# Patient Record
Sex: Male | Born: 1965 | Hispanic: No | Marital: Single | Smoking: Never smoker
Health system: Southern US, Community
[De-identification: ages and names within clinical notes are randomized; demographics above are authoritative.]

## PROBLEM LIST (undated history)

## (undated) DIAGNOSIS — Q8711 Prader-Willi syndrome: Secondary | ICD-10-CM

## (undated) DIAGNOSIS — E669 Obesity, unspecified: Secondary | ICD-10-CM

## (undated) DIAGNOSIS — F79 Unspecified intellectual disabilities: Secondary | ICD-10-CM

## (undated) DIAGNOSIS — I890 Lymphedema, not elsewhere classified: Secondary | ICD-10-CM

## (undated) HISTORY — DX: Unspecified intellectual disabilities: F79

## (undated) HISTORY — DX: Obesity, unspecified: E66.9

## (undated) HISTORY — PX: STRABISMUS SURGERY: SHX218

## (undated) HISTORY — DX: Prader-Willi syndrome: Q87.11

## (undated) HISTORY — DX: Lymphedema, not elsewhere classified: I89.0

---

## 2005-09-03 ENCOUNTER — Ambulatory Visit: Payer: Self-pay | Admitting: Internal Medicine

## 2006-02-08 ENCOUNTER — Emergency Department: Payer: Self-pay | Admitting: Emergency Medicine

## 2007-10-16 ENCOUNTER — Encounter: Payer: Self-pay | Admitting: Internal Medicine

## 2007-11-05 ENCOUNTER — Encounter: Payer: Self-pay | Admitting: Internal Medicine

## 2007-12-03 ENCOUNTER — Encounter: Payer: Self-pay | Admitting: Internal Medicine

## 2008-01-03 ENCOUNTER — Encounter: Payer: Self-pay | Admitting: Internal Medicine

## 2010-01-09 ENCOUNTER — Telehealth: Payer: Self-pay | Admitting: Internal Medicine

## 2010-01-27 ENCOUNTER — Ambulatory Visit: Payer: Self-pay | Admitting: Internal Medicine

## 2010-01-27 ENCOUNTER — Encounter: Payer: Self-pay | Admitting: Internal Medicine

## 2010-01-27 DIAGNOSIS — I890 Lymphedema, not elsewhere classified: Secondary | ICD-10-CM

## 2010-01-27 DIAGNOSIS — F70 Mild intellectual disabilities: Secondary | ICD-10-CM | POA: Insufficient documentation

## 2010-01-27 DIAGNOSIS — K644 Residual hemorrhoidal skin tags: Secondary | ICD-10-CM | POA: Insufficient documentation

## 2010-01-27 DIAGNOSIS — L02219 Cutaneous abscess of trunk, unspecified: Secondary | ICD-10-CM

## 2010-01-27 DIAGNOSIS — L89899 Pressure ulcer of other site, unspecified stage: Secondary | ICD-10-CM

## 2010-01-27 DIAGNOSIS — Q8711 Prader-Willi syndrome: Secondary | ICD-10-CM | POA: Insufficient documentation

## 2010-01-27 DIAGNOSIS — L03319 Cellulitis of trunk, unspecified: Secondary | ICD-10-CM

## 2010-01-28 ENCOUNTER — Encounter: Payer: Self-pay | Admitting: Internal Medicine

## 2010-02-03 ENCOUNTER — Telehealth: Payer: Self-pay | Admitting: Internal Medicine

## 2010-02-03 ENCOUNTER — Encounter: Payer: Self-pay | Admitting: Internal Medicine

## 2010-02-04 ENCOUNTER — Encounter: Payer: Self-pay | Admitting: Internal Medicine

## 2010-02-10 ENCOUNTER — Encounter: Payer: Self-pay | Admitting: Internal Medicine

## 2010-02-17 ENCOUNTER — Ambulatory Visit: Payer: Self-pay | Admitting: Internal Medicine

## 2010-02-19 ENCOUNTER — Encounter: Payer: Self-pay | Admitting: Internal Medicine

## 2010-02-24 ENCOUNTER — Telehealth: Payer: Self-pay | Admitting: Internal Medicine

## 2010-03-10 ENCOUNTER — Telehealth: Payer: Self-pay | Admitting: Internal Medicine

## 2010-03-10 ENCOUNTER — Encounter: Payer: Self-pay | Admitting: Internal Medicine

## 2010-03-17 ENCOUNTER — Encounter: Payer: Self-pay | Admitting: Internal Medicine

## 2010-03-18 ENCOUNTER — Encounter: Payer: Self-pay | Admitting: Internal Medicine

## 2010-03-19 ENCOUNTER — Encounter: Payer: Self-pay | Admitting: Internal Medicine

## 2010-03-31 ENCOUNTER — Encounter: Payer: Self-pay | Admitting: Internal Medicine

## 2010-03-31 ENCOUNTER — Ambulatory Visit: Payer: Self-pay | Admitting: Internal Medicine

## 2010-03-31 DIAGNOSIS — F340 Cyclothymic disorder: Secondary | ICD-10-CM | POA: Insufficient documentation

## 2010-03-31 DIAGNOSIS — F39 Unspecified mood [affective] disorder: Secondary | ICD-10-CM

## 2010-07-01 ENCOUNTER — Ambulatory Visit: Payer: Self-pay | Admitting: Internal Medicine

## 2010-07-01 ENCOUNTER — Encounter: Payer: Self-pay | Admitting: Internal Medicine

## 2010-07-01 DIAGNOSIS — L280 Lichen simplex chronicus: Secondary | ICD-10-CM

## 2010-09-30 ENCOUNTER — Encounter: Payer: Self-pay | Admitting: Internal Medicine

## 2010-10-12 ENCOUNTER — Telehealth: Payer: Self-pay | Admitting: Internal Medicine

## 2010-10-26 ENCOUNTER — Telehealth: Payer: Self-pay | Admitting: Internal Medicine

## 2010-10-28 ENCOUNTER — Encounter: Payer: Self-pay | Admitting: Internal Medicine

## 2010-10-28 ENCOUNTER — Other Ambulatory Visit: Payer: Self-pay | Admitting: Internal Medicine

## 2010-10-29 ENCOUNTER — Telehealth: Payer: Self-pay | Admitting: Internal Medicine

## 2010-11-03 NOTE — Progress Notes (Signed)
Summary: Nurse visits  Phone Note Call from Patient Call back at Home Phone [phone]   Caller: Mom Call For: Cindee Salt MD Summary of Call: pt's mother is wondering what the nurses schedule is for coming out to see the patient? nurse had to re-schedule and mom was wondering how many times nurse should be coming out? Please advise Initial call taken by: DeShannon Katrinka Blazing CMA Duncan Dull),  Feb 24, 2010 3:53 PM  Follow-up for Phone Call        I really don't know  she has to check with [facility] Follow-up by: Cindee Salt MD,  Feb 24, 2010 8:02 PM  Additional Follow-up for Phone Call Additional follow up Details #1::        pt's mother wanted to know if it's necessary for the nurse to continue, mother thinks it's depression. DeShannon Smith CMA Duncan Dull)  Feb 25, 2010 4:35 PM   I think it would help to have someone visit and I cannot get out there that often Cindee Salt MD  Feb 25, 2010 7:31 PM   mom states she will ask the nurse to call us Additional Follow-up by: Mervin Hack CMA [facility] Dull),  Feb 26, 2010 2:32 PM

## 2010-11-03 NOTE — Letter (Signed)
Summary: Order for [facility]  Order for [facility]   Imported By: Lanelle Bal 03/17/2010 09:52:50  _____________________________________________________________________  External Attachment:    Type:   Image     Comment:   External Document

## 2010-11-03 NOTE — Assessment & Plan Note (Signed)
Summary: Home visit   Vital Signs:  Patient profile:   45 year old male Pulse rate:   84 / minute Resp:     20 per minute BP sitting:   110 / 60  (right arm) Cuff size:   large  Serial Vital Signs/Assessments:  Comments: done at right wrist By: Cindee Salt MD   CC: follow up home visit   History of Present Illness:  Mom here as usual  Onlhy took 1 fluoxetine then flushed the rest down the toilet Has actually improved a little Mom wonders if he is getting over the grieving process finally Still hasn't been out of the house--now up to 1 year being inside  Has been refusing lymphedema treatments lately  Don't seem any worse though Has a few open areas moslty where he scratches  Gets up and down the stairs--but doesn't go up that often Mom got him a new cable box so he has more TV choices Likes Transformers and other toys still  Rash on abdomen and arms Itch and scratch  Allergies: No Known Drug Allergies  Past History:  Past medical, surgical, family and social histories (including risk factors) reviewed for relevance to current acute and chronic problems.  Past Medical History: Reviewed history from 01/27/2010 and no changes required. Prader-Wiil syndrome Lymphedema Hemorrhoids Obestiy Mental retardation  Past Surgical History: Reviewed history from 01/27/2010 and no changes required. Strabismus surgery x 2--unsuccessful (as child)  Family History: Reviewed history from 01/27/2010 and no changes required. Dad died @83  of Altzheimers. Had CAD, AAA Mom is healthy except for arthritis 1 brother alive 1 sister died of multiple myeloma DM in maternal GM and uncle  Social History: Reviewed history from 01/27/2010 and no changes required. Liives at home with his mother Did not finish high school Only job at [employer] but that didn't work out Never Smoked Alcohol use-no  Review of Systems       Sleeps okay---  10PM to 4AM.  Then up and  watches TV. THen daydreams in chair. Naps later on Appetite is never a problem  Physical Exam  General:  alert.  NAD Ears:  L ear normal.   Neck:  supple, no masses, and no thyromegaly.   Lungs:  normal respiratory effort, no intercostal retractions, no accessory muscle use, and normal breath sounds.   Heart:  normal rate, regular rhythm, no murmur, and no gallop.   Abdomen:  soft and non-tender.   Extremities:  Massive lymphedema in feet and legs No sig change Skin:  excoriated leisons on abd and volar right forearm Several reedy areas in legs---esp a left lower thigh pannus several indurated red areas on legs--none open or apparently infected Psych:  normally interactive, good eye contact, not anxious appearing, and not depressed appearing.     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 Insert 1 suppository three times a day as needed for pain or bleeding 2)  Triamcinolone Acetonide 0.1 % Lotn (Triamcinolone acetonide) .... Apply to rash to reduce itching up to three times a day  Other Orders: Flu Vaccine 39yrs + ([address]) Admin 1st Vaccine ([address])  Patient Instructions: 1)  Will plan follow up home  visit in 2-3  months Prescriptions: TRIAMCINOLONE ACETONIDE 0.1 % LOTN (TRIAMCINOLONE ACETONIDE) apply to rash to reduce itching up to three times a day  #60cc x 3   Entered and Authorized by:   Cindee Salt MD   Signed by:   Cindee Salt MD on 07/01/2010   Method used:   Electronically to        [facility]* (retail)       [address]       [place], [place]  [address]       Ph: [phone]       Fax: [phone]   RxID:   [id number]   Prevention & Chronic Care Immunizations   Influenza vaccine: Fluvax 3+  (07/01/2010)    Tetanus booster: Not documented    Pneumococcal vaccine: Not documented  Other Screening   Smoking status: never  (01/27/2010)  Lipids   Total Cholesterol: Not documented   LDL: Not documented   LDL Direct: Not documented   HDL: Not documented   Triglycerides: Not documented    Immunizations Administered:  Influenza Vaccine # 1:    Vaccine Type: Fluvax 3+    Site: left deltoid    Mfr: GlaxoSmithKline    Dose: 0.5 ml    Route: IM    Given by: Cindee Salt MD    Exp. Date: 04/03/2011    Lot #: [id number]    VIS given: 04/28/10 version given July 01, 2010.    Physician counseled: yes  Flu Vaccine Consent Questions:    Do you have a history of severe allergic reactions to this vaccine? no    Any prior history of allergic reactions to egg and/or gelatin? no    Do you have a sensitivity to the preservative Thimersol? no    Do you have a past history of Guillan-Barre Syndrome? no    Do you currently have an acute febrile illness? no    Have you ever had a severe reaction to latex? no    Vaccine information given and explained to patient? yes

## 2010-11-03 NOTE — Miscellaneous (Signed)
Summary: Admission Notice/[facility]  Admission Notice/[facility]   Imported By: Lanelle Bal 02/10/2010 10:04:28  _____________________________________________________________________  External Attachment:    Type:   Image     Comment:   External Document

## 2010-11-03 NOTE — Miscellaneous (Signed)
Summary: Discharge Notice/[facility]   Discharge Notice/[facility]   Imported By: Lanelle Bal 03/24/2010 13:37:46  _____________________________________________________________________  External Attachment:    Type:   Image     Comment:   External Document

## 2010-11-03 NOTE — Letter (Signed)
Summary: Historic Patient File  Historic Patient File   Imported By: Carin Primrose 01/28/2010 16:08:52  _____________________________________________________________________  External Attachment:    Type:   Image     Comment:   External Document

## 2010-11-03 NOTE — Miscellaneous (Signed)
Summary: Orders/[facility]  Orders/[facility]   Imported By: Lester [place] 03/21/2010 11:04:53  _____________________________________________________________________  External Attachment:    Type:   Image     Comment:   External Document

## 2010-11-03 NOTE — Miscellaneous (Signed)
Summary: Care Plan/[facility]  Care Plan/[facility]   Imported By: Lanelle Bal 02/10/2010 10:03:05  _____________________________________________________________________  External Attachment:    Type:   Image     Comment:   External Document

## 2010-11-03 NOTE — Progress Notes (Signed)
  Phone Note Call from Patient Call back at Home Phone [phone]   Caller: Alma Friendly Call For: Dr.Sarvesh Meddaugh Summary of Call: Pt's mother called.  She is very happy that you agreed to see her son.  She's home most of the time,but she will be gone on April 13 and she'll be @ a Doctor's appt. on Friday,April 15 after 3:00.  Pt's mother said they have an answering machine,but it's on the back porch and she doesn't always remember to check messages. Please call. Initial call taken by: Beau Fanny,  January 09, 2010 4:40 PM  Follow-up for Phone Call        called and spoke to mom  unable to get out without help of at least 3 men and very afraid of falling  will set up home visit in the next few weeks Follow-up by: Cindee Salt MD,  January 12, 2010 5:48 PM

## 2010-11-03 NOTE — Miscellaneous (Signed)
Summary: Update/[facility]  Update/[facility]   Imported By: Lanelle Bal 03/17/2010 09:51:07  _____________________________________________________________________  External Attachment:    Type:   Image     Comment:   External Document

## 2010-11-03 NOTE — Assessment & Plan Note (Signed)
Summary: Home visit   Vital Signs:  Patient profile:   45 year old male Pulse rate:   84 / minute Resp:     14 per minute BP sitting:   120 / 70  Serial Vital Signs/Assessments:  Comments: BP in left radial--hard to hearer By: Cindee Salt MD   CC: Initial home visit   History of Present Illness: Initial home visit He is home bound due to obesity and severe lymphedema Uses treatments (lymphedema pump therapy) then wraps for this History of Prader-Willi syndrome diagnosed at age 90 months At times gives mom a hard time--refuses Rx from mom in spurts  Does use bathroom himself can walk up stairs but doing it only occ Bat[place] with help of mom---becoming harder and harder (uses bath chair)  Otherwise fairly healthy  did have UTI several months ago no urinary symptoms noted now  Sore under right buttocks cleaning with peroxide then fanny cream, then neopsorin Now with rash and perhaps infection over right breast  Preventive Screening-Counseling & Management  Alcohol-Tobacco     Smoking Status: never  Allergies (verified): No Known Drug Allergies  Past History:  Past Medical History: Prader-Wiil syndrome Lymphedema Hemorrhoids Obestiy Mental retardation  Past Surgical History: Strabismus surgery x 2--unsuccessful (as child)  Family History: Dad died @83  of Altzheimers. Had CAD, AAA Mom is healthy except for arthritis 1 brother alive 1 sister died of multiple myeloma DM in maternal GM and uncle  Social History: Liives at home with his mother Did not finish high school Only job at [employer] but that didn't work out Never Smoked Alcohol use-no Smoking Status:  never  Review of Systems General:  used to walk but now sedentary---stays in chair all day (recliner) sleeps okay--mom thinks too much at times. Eyes:  chronic poor vision---does okay with glasses chroinic eye infections--?blepharitis. ENT:  Denies decreased hearing and ringing  in ears; not taking adequate care of his teeth. CV:  Complains of fainting and lightheadness; denies chest pain or discomfort, difficulty breathing at night, and difficulty breathing while lying down; fainted once in the bathtub some months ago and once while eating (long ago). Resp:  Denies cough; mom notes some funny noises. GI:  Complains of bloody stools and hemorrhoids; episdoc bleeding---anusol supp help tremendously. GU:  Denies dysuria and incontinence. MS:  Complains of joint pain; occ joint pain Baker's cyst found at vascular eval some muscle cramps in hands and stomach---worse with lasix. Derm:  See HPI; multiple skin areas. Neuro:  Complains of weakness; denies headaches. Psych:  Complains of anxiety and depression; mom notes  mood problems lately Dad died 11/08/22. Allergy:  Denies seasonal allergies and sneezing.  Physical Exam  General:  alert.  Morbid obesity Eyes:  pupils equal Strabismus with deviation of L>R eye upwards Mouth:  no erythema and no exudates.   Neck:  no masses and no cervical lymphadenopathy.   Lungs:  normal respiratory effort and normal breath sounds.   Heart:  normal rate, regular rhythm, no murmur, and no gallop.   Abdomen:  soft and non-tender.   Msk:  no joint tenderness and no joint swelling.   Neurologic:  obviously limited cognitive ability but appropriate interaction difficulty standing from chair but able to do it over time Skin:  small stage 2 ulcer at top of left thigh  below buttocks Mild inflammation without sig warmth or tenderness  ulcer with eschar on right breast 3cm surrounding erythema, warmth and some tenderness Axillary Nodes:  No palpable lymphadenopathy Psych:  normally interactive, good eye contact, not anxious appearing, and not depressed appearing.     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 Insert 1 suppository three times a day as needed for pain or bleeding 2)  Doxycycline Hyclate 100 Mg Caps (Doxycycline hyclate) .Marland Kitchen.. 1 tab by mouth two times a day for skin infection  Patient Instructions: 1)  Will plan home visit again in 2-3 months Prescriptions: DOXYCYCLINE HYCLATE 100 MG CAPS (DOXYCYCLINE HYCLATE) 1 tab by mouth two times a day for skin infection  #20 x 1   Entered and Authorized by:   Cindee Salt MD   Signed by:   Cindee Salt MD on 01/27/2010   Method used:   Electronically to        [facility]* (retail)       [address]       [place], [place]  [address]       Ph:  [phone]       Fax: [phone]   RxID:   [id number]   Appended Document: Home visit office notes from Dr Judithann Sheen and Dr Earnestine Leys reviewed and will be scanned

## 2010-11-03 NOTE — Assessment & Plan Note (Signed)
Summary: Home visit   Vital Signs:  Patient profile:   45 year old male Pulse rate:   78 / minute Resp:     16 per minute BP sitting:   110 / 70  History of Present Illness: Here with mom as usual  did have therapy in---started to work with them, then seemed to stop trying "I tried my best"------mom agrees that he did try but limited by his prior inactivity and ?cognitive issues Walks in house with walker--very slow  Sleeps in recliner does go upstairs intermittently--like for a bath, play games, watch movies (in his room upstairs)  Uses lymphedema cuff still not two times a day but usually at least daily Mom notes 1 small open area --started bacitracin Often gets irritable and opposes mom about  the lymphedema treatments  Occ bleeding from hemorrhoids still has some suppositories  hardest thing is having no shoes to fit his shoes  Gets very angry at times seems to be more freq of late very verbal with ugly language screams  occ will throw garbage can but not aggressive towards mom but it bothers her a lot  Allergies: No Known Drug Allergies  Past History:  Past medical, surgical, family and social histories (including risk factors) reviewed for relevance to current acute and chronic problems.  Past Medical History: Reviewed history from 01/27/2010 and no changes required. Prader-Wiil syndrome Lymphedema Hemorrhoids Obestiy Mental retardation  Past Surgical History: Reviewed history from 01/27/2010 and no changes required. Strabismus surgery x 2--unsuccessful (as child)  Family History: Reviewed history from 01/27/2010 and no changes required. Dad died @83  of Altzheimers. Had CAD, AAA Mom is healthy except for arthritis 1 brother alive 1 sister died of multiple myeloma DM in maternal GM and uncle  Social History: Reviewed history from 01/27/2010 and no changes required. Liives at home with his mother Did not finish high school Only job at [employer] but that didn't work out Never Smoked Alcohol use-no  Review of Systems       appetite is still quite good skin lesions did clear okay with the doxycycline sleeps okay--mom feels he sleeps too much [employer], cookbooks and cartoons has read [employer], etc along with books on tape  Physical Exam  General:  alert.  NAD Neck:  supple, no masses, and no cervical lymphadenopathy.   Lungs:  normal respiratory effort, no intercostal retractions, no accessory muscle use, and normal breath sounds.   Heart:  normal rate, regular rhythm, no murmur, and no gallop.   Abdomen:  soft and non-tender.   Msk:  no joint tenderness and no joint swelling.   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) Assessment Comment Only has increasing anger mom feels he was limited with PT and going out because he is "terrified" Still seems to be working through father's death (along with 2 other deaths around the same time) could be a complicated grieving on top of a fragile affective state  discussed options will try low dose fluoxetine  Problem # 2:  LYMPHEDEMA, 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 Insert 1 suppository three times a day as needed for pain or bleeding 2)  Fluoxetine Hcl 10 Mg Caps (Fluoxetine hcl) .Marland Kitchen.. 1 tab daily for mood issues  Patient Instructions: 1)  Will plan home visit again in 2-3 months Prescriptions: FLUOXETINE HCL 10 MG CAPS (FLUOXETINE HCL) 1 tab daily for mood issues  #30 x 11   Entered and Authorized by:   Cindee Salt MD   Signed by:   Cindee Salt MD on 03/31/2010   Method used:   Electronically to        [facility]* (retail)       [address]       [place], [place]  [address]       Ph: [phone]       Fax: [phone]   RxID:   [id number]

## 2010-11-03 NOTE — Miscellaneous (Signed)
Summary: Fasting Glucose Order/[facility]  Fasting Glucose Order/[facility]   Imported By: Lanelle Bal 02/26/2010 08:19:15  _____________________________________________________________________  External Attachment:    Type:   Image     Comment:   External Document

## 2010-11-03 NOTE — Miscellaneous (Signed)
Summary: Care Plan/[facility]  Care Plan/[facility]   Imported By: Lanelle Bal 02/20/2010 13:20:40  _____________________________________________________________________  External Attachment:    Type:   Image     Comment:   External Document

## 2010-11-03 NOTE — Progress Notes (Signed)
Summary: Psychiatric barriers  Phone Note From Other Clinic   Caller: Wes Reynolds, Phys. Ther. with [facility] Call For: Dr. Alphonsus Sias Summary of Call: Caller says that he will fax this information but wanted to let you know that Mr. Osowski progressed well for several weeks with physical therapy.  The main objective was to reintegrate him into society.  He was taken out in public on one occasion and displayed signs of psychiatric barriers.  They have not been able to get him to agree to go out again.  Therapist is asking for your advice.  He says they could discharge him for now to get psychiatric consult and then re-admit him at a later date to continue therapy or do you prefer to see him to change medications, etc.? Initial call taken by: Delilah Shan CMA [facility] Dull),  March 10, 2010 11:48 AM  Follow-up for Phone Call        go ahead and discharge him if maximal gains made I will review options with him and mom at my next visit Follow-up by: Cindee Salt MD,  March 10, 2010 1:17 PM  Additional Follow-up for Phone Call Additional follow up Details #1::        spoke with Hassie Bruce, faxed back physican orders and rx for walker. forms scanned Additional Follow-up by: Mervin Hack CMA Duncan Dull),  March 10, 2010 2:24 PM

## 2010-11-03 NOTE — Progress Notes (Signed)
Summary: blood sugar  Phone Note Other Incoming Call back at [phone]   Caller: Boneta Lucks with [facility]- [phone] Summary of Call: Boneta Lucks says that you had requested to have blood sugars checked. She says that patient does not have glucose meter. She wants to know if you want blood work you could give her verbal order or if you want to send rx for glucose meter to [facility] pharmacy.  Initial call taken by: Melody Comas,  Feb 03, 2010 9:02 AM  Follow-up for Phone Call        I just wanted her to do fingerstick once to exclude diabetes If not possible, will order blood work Follow-up by: Cindee Salt MD,  Feb 03, 2010 12:12 PM  Additional Follow-up for Phone Call Additional follow up Details #1::        spoke with [facility] and she states they do not carry glucose meters and pt does not have one in the home. Which labs did you want, per Boneta Lucks? DeShannon Smith CMA Duncan Dull)  Feb 04, 2010 9:07 AM    Just draw fasting blood sugar Cindee Salt MD  Feb 04, 2010 9:08 AM   spoke with Boneta Lucks and advised results, she will go out next Monday or Tues for his reg scheduled visit.  Additional Follow-up by: Mervin Hack CMA (AAMA),  Feb 04, 2010 9:20 AM

## 2010-11-03 NOTE — Miscellaneous (Signed)
Summary: PT Orders/[facility]  PT Orders/[facility]   Imported By: Lanelle Bal 03/23/2010 10:00:49  _____________________________________________________________________  External Attachment:    Type:   Image     Comment:   External Document

## 2010-11-05 NOTE — Progress Notes (Signed)
Summary: ulcers on legs  Phone Note Call from Patient Call back at Home Phone [phone]   Caller: Patient Call For: Cindee Salt MD Summary of Call: Patient's mom says that he has 8-10 ulcers that are really bad on his leg. She says that she has been trying to doctor it her self for several days. She says that normally his ulcers go away on there own after a couple of day, but these aren't getting any better. She is asking if he should take an antibiotic for this. Uses [facility].  Initial call taken by: Melody Comas,  October 29, 2010 4:06 PM  Follow-up for Phone Call        we are starting antibiotic in case he has infection will change to augmentin in stead which will handle urine and infection  augmentin 875 two times a day x 10 days cancel amoxil Follow-up by: Cindee Salt MD,  October 29, 2010 4:23 PM  Additional Follow-up for Phone Call Additional follow up Details #1::        Rx Called In, spoke with parent and advised results.  Additional Follow-up by: Mervin Hack CMA Duncan Dull),  October 29, 2010 5:14 PM    New/Updated Medications: AMOXICILLIN-POT CLAVULANATE 875-125 MG TABS (AMOXICILLIN-POT CLAVULANATE) 1 by mouth two times a day Prescriptions: AMOXICILLIN-POT CLAVULANATE 875-125 MG TABS (AMOXICILLIN-POT CLAVULANATE) 1 by mouth two times a day  #20 x 0   Entered by:   Mervin Hack CMA (AAMA)   Authorized by:   Cindee Salt MD   Signed by:   Mervin Hack CMA (AAMA) on 10/29/2010   Method used:   Electronically to        [facility]* (retail)       [address]       [place], [place]  [address]       Ph: [phone]       Fax: [phone]   RxID:   [id number]

## 2010-11-05 NOTE — Progress Notes (Signed)
Summary: pt has a pain in his neck  Phone Note Call from Patient Call back at Home Phone [phone]   Caller: Mom Summary of Call: Pt has a muscle spasm in his neck.  Mother is treating with ES tylenol every 4-5 hours, using a heating pad.  Mother is asking if there is anything else that she should be doing.  This has been bothering him since friday night or saturday morning. Initial call taken by: Lowella Petties CMA, AAMA,  October 12, 2010 4:11 PM  Follow-up for Phone Call        okay to try carisprodol 350mg  1/2 -1 tab two times a day for muscle spasm #30 x 0 Follow-up by: Cindee Salt MD,  October 12, 2010 4:19 PM  Additional Follow-up for Phone Call Additional follow up Details #1::        spoke with parent and advised results. rx sent to [facility] Additional Follow-up by: Mervin Hack CMA Duncan Dull),  October 12, 2010 4:37 PM    New/Updated Medications: CARISOPRODOL 350 MG TABS (CARISOPRODOL) 1/2 -1 tab two times a day for muscle spasm Prescriptions: CARISOPRODOL 350 MG TABS (CARISOPRODOL) 1/2 -1 tab two times a day for muscle spasm  #30 x 0   Entered by:   Mervin Hack CMA (AAMA)   Authorized by:   Cindee Salt MD   Signed by:   Mervin Hack CMA (AAMA) on 10/12/2010   Method used:   Electronically to        [facility]* (retail)       [address]       [place], [place]  [address]       Ph: [phone]       Fax: [phone]   RxID:   [id number]

## 2010-11-05 NOTE — Assessment & Plan Note (Signed)
Summary: Home visit   Vital Signs:  Patient profile:   45 year old male Pulse rate:   66 / minute Resp:     18 per minute BP sitting:   114 / 80  (left arm)  History of Present Illness: Mom here as usual  Doing about the same --per mom has been less and less mobile  edema is still massive does use the lymphedema pumps reguarly No open areas on legs now Did have some "hot spots" recently but they are better now  got new transformers for Christmas working on building them and enjoys changing them from one form to another  Doesn't spend any time outside no sun exposure  appetite remains fine unsure if there is a change in weight No falls recently  still can walk around the house but slowly goes upstairs only very occ  Had spell of loose stools---4 times a day for several days occurred 2 weeks ago but has gotten better no abd pain  Gets very angry at times talks back to mom and uses "ugly" language then pulls it together and will be respectful  Allergies: No Known Drug Allergies  Past History:  Past medical, surgical, family and social histories (including risk factors) reviewed for relevance to current acute and chronic problems.  Past Medical History: Reviewed history from 01/27/2010 and no changes required. Prader-Wiil syndrome Lymphedema Hemorrhoids Obestiy Mental retardation  Past Surgical History: Reviewed history from 01/27/2010 and no changes required. Strabismus surgery x 2--unsuccessful (as child)  Family History: Reviewed history from 01/27/2010 and no changes required. Dad died @83  of Altzheimers. Had CAD, AAA Mom is healthy except for arthritis 1 brother alive 1 sister died of multiple myeloma DM in maternal GM and uncle  Social History: Reviewed history from 01/27/2010 and no changes required. Liives at home with his mother Did not finish high school Only job at [employer] but that didn't work out Never Smoked Alcohol  use-no  Review of Systems  The patient denies chest pain, syncope, and dyspnea on exertion.         still can't find any shoe to wear Heels keep callous on them despite mom's efforts sleeps okay--often up at night and will sleep in daytime also  Physical Exam  General:  alert.  NAD Neck:  supple, no masses, and no cervical lymphadenopathy.   Lungs:  normal respiratory effort, no intercostal retractions, no accessory muscle use, and normal breath sounds.   Heart:  normal rate, regular rhythm, no murmur, and no gallop.   Abdomen:  soft, non-tender, and no masses.   Extremities:  massive lymphedema in legs feet barely visible as legs hang over and cover feet Skin:  mild intertrigo in leg folds over feet no open areas or ulcers Psych:  normally interactive, good eye contact, not anxious appearing, and not depressed appearing.     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 Insert 1 suppository  three times a day as needed for pain or bleeding 2)  Triamcinolone Acetonide 0.1 % Lotn (Triamcinolone acetonide) .... Apply to rash to reduce itching up to three times a day 3)  Vitamin D3 50000 Unit Caps (Cholecalciferol) .Marland Kitchen.. 1 tab by mouth monthly  Patient Instructions: 1)  Will plan follow up in 2-3  months Prescriptions: VITAMIN D3 50000 UNIT CAPS (CHOLECALCIFEROL) 1 tab by mouth monthly  #12 x 0   Entered and Authorized by:   Cindee Salt MD   Signed by:   Cindee Salt MD on 09/30/2010   Method used:   Electronically to        [facility]* (retail)       [address]       [place], [place]  [address]       Ph: [phone]       Fax: [phone]   RxID:   [id number]

## 2010-11-05 NOTE — Progress Notes (Signed)
Summary: urine is dark brown, had strong odor  Phone Note Call from Patient Call back at Home Phone [phone]   Caller: Patient Call For: Cindee Salt MD Summary of Call: Mom says that patient's urine is very dark, it is staining his underpants, and she says that it has a very strong odor. Patient is not cmplaining of any pain, she is just concerned about the color and odor.  She is asking if you want to see him or if she could bring in a urine sample. Please advise. Initial call taken by: Melody Comas,  October 26, 2010 11:47 AM  Follow-up for Phone Call        Usually that just means it is too concentrated He needs to drink lots more water---at least 2 quarts per day (if she can get him to) If still not improved, may want to check urine at that point Cindee Salt MD  October 26, 2010 12:07 PM   left message on machine at home for patient to return my call.  DeShannon Smith CMA Duncan Dull)  October 26, 2010 2:50 PM   Patient's mom says that you can smell the odor from the urine in the next room, it's so dark that it almost looks bloody and it has been this way for a while. She also says that she can't get him to drink that much water and that it is very frustrating because no one understands how difficult he can be.  She is asking if there are any other suggestions and how long after pushing water should she wait before having the urine checked.  Melody Comas  October 27, 2010 10:45 AM  Have her bring in a urine for U/A and culture now (Dx-hematuria) Need to make sure there isn't blood Reassure her that I understand how difficult he can be----she is doing all that can be done Follow-up by: Cindee Salt MD,  October 27, 2010 1:47 PM  Additional Follow-up for Phone Call Additional follow up Details #1::        spoke with mother and she wanted to take urine over to [facility], I advised that we couldn't order a urine there, per mom he used to be a pt there for  years. I advised mom I would ask Dr.Hawthorne Day what could be done, mom states she would have to drive all the way to [place] to bring the urine, I advise where we are located and still said it's too far. Please advise. DeShannon Smith CMA Duncan Dull)  October 27, 2010 4:54 PM   We can have her bring it to [facility] Please fax the order there and hopefully they can just take the urine from her Call lab at [facility] to see if they can do that Cindee Salt MD  October 28, 2010 7:40 AM   spoke with [facility] lab, yes they can do the urine dip. I faxed order over to them advised pt's mom where to go at [facility], she wanted me to tell Dr.Emma Birchler thank you and that this will give her piece of mind. DeShannon Katrinka Blazing CMA Duncan Dull)  October 28, 2010 9:22 AM   will await the results Additional Follow-up by: Cindee Salt MD,  October 28, 2010 9:28 AM

## 2010-11-13 ENCOUNTER — Telehealth: Payer: Self-pay | Admitting: Internal Medicine

## 2010-11-16 ENCOUNTER — Telehealth: Payer: Self-pay | Admitting: Internal Medicine

## 2010-11-18 ENCOUNTER — Encounter: Payer: Self-pay | Admitting: Internal Medicine

## 2010-11-18 DIAGNOSIS — R3989 Other symptoms and signs involving the genitourinary system: Secondary | ICD-10-CM | POA: Insufficient documentation

## 2010-11-18 DIAGNOSIS — I890 Lymphedema, not elsewhere classified: Secondary | ICD-10-CM

## 2010-11-18 DIAGNOSIS — L02219 Cutaneous abscess of trunk, unspecified: Secondary | ICD-10-CM

## 2010-11-18 DIAGNOSIS — L03319 Cellulitis of trunk, unspecified: Secondary | ICD-10-CM

## 2010-11-19 NOTE — Progress Notes (Signed)
Summary: pain w/ urination / ulcers oozing  Phone Note Call from Patient Call back at Home Phone [phone]   Caller: Gordon [name]- mother Call For: Cindee Salt MD Summary of Call: Patient's mother says  that [name] is complaining of burning and pain w/ urination and that the strong odor is back. She also says that the ulcers on his legs and oozing. She is asking what she should do at this point.  Initial call taken by: Melody Comas,  November 13, 2010 2:18 PM  Follow-up for Phone Call        okay to try treatment with a septra DS (generic) #20 x 0 I can try to get out there next week if it isn't any better by then Follow-up by: Cindee Salt MD,  November 13, 2010 3:02 PM  Additional Follow-up for Phone Call Additional follow up Details #1::        Rx Called In, spoke with mom and advised results, advised mom to call back on Monday to let us know how pt is doing. Additional Follow-up by: Mervin Hack CMA Duncan Dull),  November 13, 2010 3:07 PM    New/Updated Medications: SULFAMETHOXAZOLE-TRIMETHOPRIM 800-160 MG/20ML SUSP (SULFAMETHOXAZOLE-TRIMETHOPRIM) take 1 by mouth two times a day Prescriptions: SULFAMETHOXAZOLE-TRIMETHOPRIM 800-160 MG/20ML SUSP (SULFAMETHOXAZOLE-TRIMETHOPRIM) take 1 by mouth two times a day  #20 x 0   Entered by:   Mervin Hack CMA (AAMA)   Authorized by:   Cindee Salt MD   Signed by:   Mervin Hack CMA (AAMA) on 11/13/2010   Method used:   Electronically to        [facility]* (retail)       [address]       [place], [place]  [address]       Ph: [phone]       Fax: [phone]   RxID:   [id number]

## 2010-11-25 NOTE — Progress Notes (Signed)
Summary: mother calling with update  Phone Note Call from Patient Call back at Home Phone [phone]   Caller: Winfred Burn Summary of Call: Mother states pt feels better today, better than he has felt in awhile, but still has some burning and hurting when he urinates.  Mother states the urine is still dark and has a strong odor.  He is doing well with drinking extra fluids.  She says his legs are still weeping yellow fluid and look awful- skin is very red and has white patches, skin falls off when mother wipes with alcohol.  She is asking if she should do his lymphadema treatments, or would that make things worse? Initial call taken by: Lowella Petties CMA, AAMA,  November 16, 2010 4:20 PM  Follow-up for Phone Call        Discussed this wife his mom will plan visit on Wednesday afernoon Follow-up by: Cindee Salt MD,  November 16, 2010 4:39 PM

## 2010-11-25 NOTE — Assessment & Plan Note (Signed)
Summary: Home visit   Vital Signs:  Patient profile:   45 year old male Pulse rate:   90 / minute Resp:     20 per minute BP sitting:   112 / 70  History of Present Illness: Visit pushed up for urinary problems and increased leg sores Has had some brown changes in urine No dysuria but does get a slight stinging sensation No clear blood No fever  Having sores on the sides of his calves Worst on right just below knee having discharge on bandages No sig pain though has some lacy redness in other areas skin seems friable  Had course of augmenitn without help Now still on bactrim--may have responded some  Mom has not been using the lymphedema machine due to concern about the infection  Allergies: No Known Drug Allergies  Review of Systems       appetite is okay stools had been okay but seems less on the antibioitic---- three times a day before and now once or twice  Physical Exam  General:  alert.  NAD Neck:  supple and no masses.   Lungs:  normal respiratory effort, no intercostal retractions, no accessory muscle use, and normal breath sounds.   Heart:  normal rate, regular rhythm, no murmur, and no gallop.   Abdomen:  soft and non-tender.   Extremities:  massive lymphedema---worse then in the past Skin:  mild diffuse erythema on lower calves but no signs of infecton Multiple ulcers--mostly on posterior right calf--some early eschar and mild surrounding inflammation Psych:  normally interactive and not anxious appearing.     Impression & Recommendations:  Problem # 1:  OTHER SYMPTOMS INVOLVING URINARY SYSTEM (ICD-788.99) Assessment New I examined the urine here Dip negative for urobiinogen, nitrites, leukocytes and blood Color is normal now---clear yellow  Not sure why it was brown but nothing to suggest blood or glomerular disease  observation only  Problem # 2:  CELLULITIS/ABSCESS, TRUNK (ICD-682.2) Assessment: Deteriorated clearly looks like MRSA now is  better though will continue the sulfa for a while apparently started after a prolonged bout of him scratching it  His updated medication list for this problem includes:    Sulfamethoxazole-trimethoprim 800-160 Mg/26ml Susp (Sulfamethoxazole-trimethoprim) .Marland KitchenMarland KitchenMarland KitchenMarland Kitchen 4 teaspoons (20cc) by mouth two times a day for skin infeciton  Problem # 3:  LYMPHEDEMA, 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 Insert 1 suppository three times a day as needed for pain or bleeding 2)  Triamcinolone Acetonide 0.1 % Lotn (Triamcinolone acetonide) .... Apply to rash to reduce itching up to three times a day 3)  Vitamin D3 50000 Unit Caps (Cholecalciferol) .Marland Kitchen.. 1 tab by mouth monthly 4)  Carisoprodol 350 Mg Tabs (Carisoprodol) .... 1/2 -1 tab two times a day for muscle spasm 5)  Sulfamethoxazole-trimethoprim 800-160 Mg/25ml Susp (Sulfamethoxazole-trimethoprim) .... 4 teaspoons (20cc) by mouth two times a day for skin infeciton apparently  Patient Instructions: 1)  Will plan follow up in 2-3 months Prescriptions: SULFAMETHOXAZOLE-TRIMETHOPRIM 800-160 MG/20ML SUSP (SULFAMETHOXAZOLE-TRIMETHOPRIM) 4 teaspoons (20cc) by mouth two times a day for skin infeciton  #400cc x 3   Entered and Authorized by:   Cindee Salt MD   Signed by:   Cindee Salt MD on 11/18/2010   Method used:   Electronically to        [facility]* (retail)       [address]       [place], [place]  [address]       Ph: [phone]       Fax: [phone]   RxID:   [id number]   Appended Document: Home visit might have had urine infection that responded to the antibiotics

## 2010-11-26 ENCOUNTER — Telehealth: Payer: Self-pay | Admitting: Internal Medicine

## 2010-12-01 NOTE — Progress Notes (Signed)
Summary: legs are very red  Phone Note Call from Patient Call back at Home Phone [phone]   Caller: Winfred Burn Call For: Cindee Salt MD Summary of Call: Mother states pt's legs and feet are red, look like bad sunburn.  Right is worse.  Mother states pt has never had anything like this.  He had a treatment yesterday with wraps and massage, that was the first treatment he had had in several weeks.  Also, drainage from right leg improved but now he has drainage in left leg, no ulcers though on left leg. Initial call taken by: Lowella Petties CMA, AAMA,  November 26, 2010 8:49 AM  Follow-up for Phone Call        Legs looked red throughtout---visible across the room like sunburn Not warm though Not painful Better today after he finally allowed mom to use lymphedema pump  No fever not sick  P: may just be worsening venous stasis changes. No signs of cellulitis though still on bactrim for presumed MRSA Keep legs elevated and continue lymphedema Rx To ER if sick at all or legs look worse Follow-up by: Cindee Salt MD,  November 26, 2010 1:07 PM

## 2010-12-17 ENCOUNTER — Telehealth: Payer: Self-pay | Admitting: Internal Medicine

## 2010-12-20 ENCOUNTER — Encounter: Payer: Self-pay | Admitting: Internal Medicine

## 2010-12-22 NOTE — Progress Notes (Signed)
Summary: legs are not improved   Phone Note Call from Patient Call back at Home Phone [phone]   Caller: Patient Call For: Cindee Salt MD Summary of Call: Paitent's legs are not improving at all. Mom doesn't know what to do at this point. Has been taking the sulfamethoxazole. Doesn't seem to be helping at all. She is asking if he should continue the medication or try something else. Please advise. Uses [facility].  Initial call taken by: Melody Comas,  December 17, 2010 11:17 AM  Follow-up for Phone Call        limited ability to do much since he is quite homebound  Will change to clindamycin and have nurse come in to help with wound management Follow-up by: Cindee Salt MD,  December 17, 2010 2:04 PM    New/Updated Medications: CLINDAMYCIN HCL 300 MG CAPS (CLINDAMYCIN HCL) 1 by mouth three times a day for skin infection Prescriptions: CLINDAMYCIN HCL 300 MG CAPS (CLINDAMYCIN HCL) 1 by mouth three times a day for skin infection  #42 x 1   Entered and Authorized by:   Cindee Salt MD   Signed by:   Cindee Salt MD on 12/17/2010   Method used:   Electronically to        [facility]* (retail)       [address]       [place], [place]  [address]       Ph: [phone]       Fax: [phone]   RxID:   [id number]

## 2010-12-24 ENCOUNTER — Telehealth: Payer: Self-pay | Admitting: *Deleted

## 2010-12-24 NOTE — Telephone Encounter (Signed)
Okay to approve the requested services  Will have to see what the nurse thinks about his legs with him off the antibiotics

## 2010-12-24 NOTE — Telephone Encounter (Signed)
Zella Ball called to let you know that mom  Will no longer be giving patient the antibiotics, she says that it is causing him diarrhea, making him agitated, and he is refusing to take it.   Also Stanton Kidney [phone]) called asking for order to extend social services, resources, and long term planning.

## 2010-12-28 NOTE — Telephone Encounter (Signed)
Spoke with mom and she states her son's case is under investigation and she will tell her case worker Dr.Letvak has the order. I have tried calling the number provided for Stanton Kidney and the number is incorrect. Mom will let Social Services know Dr.Letvak has the order. I advised mom if she gets a call or anything else concerning this to call us with a name and number.

## 2011-01-04 DIAGNOSIS — L97809 Non-pressure chronic ulcer of other part of unspecified lower leg with unspecified severity: Secondary | ICD-10-CM

## 2011-01-04 DIAGNOSIS — L97109 Non-pressure chronic ulcer of unspecified thigh with unspecified severity: Secondary | ICD-10-CM

## 2011-01-04 DIAGNOSIS — L02219 Cutaneous abscess of trunk, unspecified: Secondary | ICD-10-CM

## 2011-01-04 DIAGNOSIS — L03319 Cellulitis of trunk, unspecified: Secondary | ICD-10-CM

## 2011-01-05 ENCOUNTER — Telehealth: Payer: Self-pay | Admitting: *Deleted

## 2011-01-05 NOTE — Telephone Encounter (Signed)
Home health nurse called to report that pt's legs are more red, draining more and his mother thinks he needs an antibiotic.  He didn't finish his last one because it caused diarrhea.  No fever.  Uses [facility].

## 2011-01-06 MED ORDER — CEPHALEXIN 500 MG PO CAPS: 500.0000 mg | ORAL_CAPSULE | Freq: Three times a day (TID) | ORAL | Status: AC

## 2011-01-06 NOTE — Telephone Encounter (Signed)
Okay to try cephalexin 500mg  tid  #30 x 0

## 2011-01-06 NOTE — Telephone Encounter (Signed)
rx sent to pharmacy, Morrie Sheldon will let mother know.

## 2011-01-15 ENCOUNTER — Encounter: Payer: Self-pay | Admitting: Internal Medicine

## 2011-01-22 ENCOUNTER — Telehealth: Payer: Self-pay | Admitting: *Deleted

## 2011-01-22 NOTE — Telephone Encounter (Signed)
Left message on home health nurse voicemail, advised to call if any questions

## 2011-01-22 NOTE — Telephone Encounter (Signed)
He has had varied urinary symptoms that have not been clearly infections I would not treat him without a positive urine culture  She can send urine culture if possible and we will treat next week if positive

## 2011-01-22 NOTE — Telephone Encounter (Signed)
Home health nurse called to report that pt is complaining of pain with urination, urine has strong odor.  He doesn't have a fever.   She is asking if you think pt should be on an antibiotic.  She says pt has problems with a lot of antibiotics- causing diarrhea. The only thing that hasnt bothered him is augmentin.  Uses [facility].

## 2011-01-27 ENCOUNTER — Telehealth: Payer: Self-pay | Admitting: *Deleted

## 2011-01-27 MED ORDER — AMOXICILLIN 875 MG PO TABS: 875.0000 mg | ORAL_TABLET | Freq: Two times a day (BID) | ORAL | Status: AC

## 2011-01-27 NOTE — Telephone Encounter (Signed)
Mother advised.  Medication sent to pharmacy.

## 2011-01-27 NOTE — Telephone Encounter (Signed)
E coli sens to betalactams.  I would start amoxil (doesn't have to use augmentin based on sensitivity results).  Fu with Dr. Alphonsus Sias prn.  Thanks.

## 2011-01-27 NOTE — Telephone Encounter (Signed)
Nurse with [facility] faxed over copy of pt's urine culture results.  Mother is asking if pt needs to be on antibiotic. Dr. Karle Starch patient.  Uses [facility].  Pt has a problem with antibiotics causing diarrhea, he does ok with augmentin. Report is on your desk.

## 2011-02-01 ENCOUNTER — Encounter: Payer: Self-pay | Admitting: Family Medicine

## 2011-02-02 ENCOUNTER — Telehealth: Payer: Self-pay | Admitting: *Deleted

## 2011-02-02 DIAGNOSIS — L97909 Non-pressure chronic ulcer of unspecified part of unspecified lower leg with unspecified severity: Secondary | ICD-10-CM

## 2011-02-02 NOTE — Telephone Encounter (Signed)
Home health nurse states pt's mother is agreeable to pt going to wound care center for the sores on his legs.  She says she will make sure she can get him there.  She is asking if a referral needs to come from you.  Nurse states mother reports that the wounds are getting worse, draining a great deal more.  Mother wants pt to go to wound center in [place].

## 2011-02-03 ENCOUNTER — Telehealth: Payer: Self-pay | Admitting: *Deleted

## 2011-02-03 NOTE — Telephone Encounter (Signed)
Will cancel appt at wound center i will try to get out there next week for reevaluation

## 2011-02-03 NOTE — Telephone Encounter (Signed)
Mother called requesting that pt receive wound care at home.  She said she has thought and worried about this all night and she doesn't know how she going to get pt to wound care center, even with EMS.  Please advise on what she can do.

## 2011-02-03 NOTE — Telephone Encounter (Signed)
It is clearly appropriate that he get seen at wound center but his mom has not been able to get him to leave house  Okay to proceed with referral and hope he makes it over there

## 2011-02-03 NOTE — Telephone Encounter (Signed)
After speaking to Dr Alphonsus Sias regarding this pt. He has decided to cancel this referral to Wound Ctr as they cannott get him to the appt. mK

## 2011-02-11 ENCOUNTER — Ambulatory Visit (INDEPENDENT_AMBULATORY_CARE_PROVIDER_SITE_OTHER): Payer: Medicare Other | Admitting: Internal Medicine

## 2011-02-11 ENCOUNTER — Encounter: Payer: Self-pay | Admitting: Internal Medicine

## 2011-02-11 VITALS — BP 102/50 | HR 72 | Resp 24

## 2011-02-11 DIAGNOSIS — L03115 Cellulitis of right lower limb: Secondary | ICD-10-CM

## 2011-02-11 DIAGNOSIS — I890 Lymphedema, not elsewhere classified: Secondary | ICD-10-CM

## 2011-02-11 DIAGNOSIS — Q8711 Prader-Willi syndrome: Secondary | ICD-10-CM

## 2011-02-11 DIAGNOSIS — F70 Mild intellectual disabilities: Secondary | ICD-10-CM

## 2011-02-11 DIAGNOSIS — L02419 Cutaneous abscess of limb, unspecified: Secondary | ICD-10-CM

## 2011-02-11 DIAGNOSIS — L89899 Pressure ulcer of other site, unspecified stage: Secondary | ICD-10-CM

## 2011-02-11 DIAGNOSIS — R3989 Other symptoms and signs involving the genitourinary system: Secondary | ICD-10-CM

## 2011-02-11 MED ORDER — CEPHALEXIN 500 MG PO TABS: 500.0000 mg | ORAL_TABLET | Freq: Two times a day (BID) | ORAL | Status: AC

## 2011-02-11 MED ORDER — FUROSEMIDE 40 MG PO TABS: 40.0000 mg | ORAL_TABLET | Freq: Every day | ORAL | Status: AC | PRN

## 2011-02-11 NOTE — Progress Notes (Signed)
Subjective:    Patient ID: Gordon [name], male    DOB: 25-Oct-1965, 45 y.o.   MRN: [id number]  HPI Having terrible problems with his legs Terribly swollen Constant drainage from calves which are soaking bandages Sores are still inflamed--- waxes and wanes though. Never clear though May have some thick yellow, occ bloody drainage from sores No fever  Has malodorous urine at times Did improve with antibiotic several weeks ago  Nurse still involved from [facility] Mom not impressed with her management of the situation  Mom has not been using the lymphedema pump Too much drainage (mom concerned about ruining canvas wraps) Right leg is much worse than left with drainage  He has had increased pain in knees lately Predates increased fluid of late Mom has given some advil or aleve and that helped considerably  Current outpatient prescriptions:ergocalciferol (VITAMIN D2) 50000 UNITS capsule, Take 50,000 Units by mouth every 30 (thirty) days.  , Disp: , Rfl: ;  hydrocortisone (ANUSOL-HC) 25 MG suppository, Place 25 mg rectally 3 (three) times daily as needed. For pain or bleeding , Disp: , Rfl: ;  DISCONTD: carisoprodol (SOMA) 350 MG tablet, Take 350 mg by mouth 2 (two) times daily as needed.  , Disp: , Rfl:  DISCONTD: triamcinolone (KENALOG) 0.1 % lotion, Apply topically 3 (three) times daily as needed. For itching , Disp: , Rfl:   Past Medical History  Diagnosis Date  . Prader-Willi syndrome   . Lymphedema     severe  . Hemorrhoids   . Obesity   . Mental retardation     Past Surgical History  Procedure Date  . Strabismus surgery child    twice but failed    Family History  Problem Relation Age of Onset  . Arthritis Mother   . Heart disease Father   . Aortic aneurysm Father   . Diabetes Maternal Uncle   . Diabetes Maternal Grandmother     History   Social History  . Marital Status: Single    Spouse Name: N/A    Number of Children: N/A  . Years of Education: N/A    Occupational History  . Disabled    Social History Main Topics  . Smoking status: Never Smoker   . Smokeless tobacco: Never Used  . Alcohol Use: No  . Drug Use: Not on file  . Sexually Active: Not on file   Other Topics Concern  . Not on file   Social History Narrative   Lives with mother who provides his care.Worked at [employer] briefly but that didn't work out   Review of [employer] is fine Sleeps okay---still in the recliner Rarely goes upstairs now No fever Bowels have been okay---2-3 per day Voids less often now    Objective:   Physical Exam  Constitutional:       Alert and cooperative Sitting in recliner Can stand with great effort (due to massive edema)  Neck: Normal range of motion. Neck supple.  Cardiovascular: Normal rate, regular rhythm and normal heart sounds.  Exam reveals no gallop.   No murmur heard. Pulmonary/Chest: Effort normal and breath sounds normal. No respiratory distress. He has no wheezes. He has no rales.  Abdominal: Soft. There is no tenderness.  Musculoskeletal: He exhibits edema.       Massive thigh and calf edema Weeping on right esp Feet also massively swollen  Lymphadenopathy:    He has no cervical adenopathy.  Skin:       Multiple ulcers on calves----mostly posterior  and on right Many are partially granulated Moderate erythema with slight warmth broadly across right posterior calf  Psychiatric:       Still with neutral mood Juvenile thought patterns Little or no insight          Assessment & Plan:

## 2011-02-11 NOTE — Patient Instructions (Signed)
Will plan follow up in about 3 months We will arrange blood work in the next few weeks through [facility]

## 2011-02-17 ENCOUNTER — Telehealth: Payer: Self-pay | Admitting: *Deleted

## 2011-02-17 NOTE — Telephone Encounter (Signed)
Spoke with Zella Ball She will make a couple more visits then close case Mom doesn't allow her to do dressings, etc. Still not compliant with lymphedema pump, etc She will draw renal at next visit

## 2011-02-17 NOTE — Telephone Encounter (Signed)
Message left on voice mail Will try back again

## 2011-02-17 NOTE — Telephone Encounter (Signed)
Home health nurse is calling regarding pt.  She wants to discharge the patient because his mother is not very open with them being there, she wont let them do anything.  Pt needs long term care, but mother doesn't agree with that.  She wants them to be on stand by, and nurse states that is not what they do. Mother refuses any type of help, aid, she will not allow anyone else in the house.   Nurse is asking that you call her to discuss when you have time.

## 2011-03-04 ENCOUNTER — Telehealth: Payer: Self-pay | Admitting: *Deleted

## 2011-03-04 NOTE — Telephone Encounter (Signed)
I was expecting that Instructions for mom on the lab Continue daily furosemide

## 2011-03-04 NOTE — Telephone Encounter (Signed)
Nurse with [facility] has faxed over lab results on patient, which are on your desk.  She wanted to let you know that she will be discharging patient.

## 2011-03-05 DIAGNOSIS — L97809 Non-pressure chronic ulcer of other part of unspecified lower leg with unspecified severity: Secondary | ICD-10-CM

## 2011-03-05 DIAGNOSIS — L97509 Non-pressure chronic ulcer of other part of unspecified foot with unspecified severity: Secondary | ICD-10-CM

## 2011-03-05 DIAGNOSIS — I872 Venous insufficiency (chronic) (peripheral): Secondary | ICD-10-CM

## 2011-03-08 ENCOUNTER — Encounter: Payer: Self-pay | Admitting: Internal Medicine

## 2011-03-18 ENCOUNTER — Telehealth: Payer: Self-pay | Admitting: *Deleted

## 2011-03-18 NOTE — Telephone Encounter (Signed)
No he can't come in, Dr.Letvak makes home visits.

## 2011-03-18 NOTE — Telephone Encounter (Signed)
It's ok to cut back to one a day although the pain is unlikely from the furosemide unless his electrolytes are off. Can he come in to our office or does Dr. Alphonsus Sias make home visits?

## 2011-03-18 NOTE — Telephone Encounter (Signed)
Pt's mother states pt has been having extreme pain in arms, feet, stomach.  She is asking if the furosemide could be causing this.  She says she is going to cut back to one a day, from 2 a day, to see if that helps.  Also, the weeping in his legs is much worse and she says she needs help wrapping them.  She had a nurse that was coming out but they had differences of opinions and the nurse discharged him.  She would like to try another home health agency, used [facility] before.  Please advise on the furosemide until Dr. Alphonsus Sias returns.

## 2011-03-18 NOTE — Telephone Encounter (Signed)
Spoke with mother and she understands about the potassium. She is still asking for some financial help with bandages with wrapping. Please advise.

## 2011-03-18 NOTE — Telephone Encounter (Signed)
Spoke with mom and advised results, per mom would it be ok to start pt on potassium? Pt is having extreme cramps, mom states Dr.Letvak told her to hold if he's having any side effects. I advised she should follow the written instructions from [facility] which stated to decrease to 1 tablet then if more side effects hold for a few days then re-start. I advised mom that Dr.Letvak would have to do recommendation for home health referral.

## 2011-03-18 NOTE — Telephone Encounter (Signed)
I would not yet start potassium without knowing his level.  Try to eat more foods rich in potassium, such as bananas.

## 2011-03-18 NOTE — Telephone Encounter (Signed)
Ok then it's ok to cut back to one lasix per day for today and tomorrow but please update [place] if his condition worsens. Please make sure this was sent to Dr. Alphonsus Sias as well.

## 2011-03-20 NOTE — Telephone Encounter (Signed)
It would be okay to try a once a day potassium supplement to see if that helped his cramps  I am unaware of anyway to get financial help for the dressings

## 2011-03-22 NOTE — Telephone Encounter (Signed)
Spoke with mom and advised results. 

## 2011-04-02 ENCOUNTER — Telehealth: Payer: Self-pay | Admitting: *Deleted

## 2011-04-02 NOTE — Telephone Encounter (Signed)
Ongoing issues I told mom again she has to use the lymphedema pump  She has been reluctant because she is afraid all the fluid will ruin the wraps I told her to wrap the compression fittings in plastic and then use the pump again Okay to try 2 diuretics daily again but he will not improve without the pump

## 2011-04-02 NOTE — Telephone Encounter (Signed)
Pt's mother is asking that you call her to discuss pt's lymphadema.  She says this has gotten out of control for her- she has several questions for you.

## 2011-05-19 ENCOUNTER — Ambulatory Visit: Payer: Medicare Other | Admitting: Internal Medicine

## 2011-05-19 ENCOUNTER — Encounter: Payer: Self-pay | Admitting: Internal Medicine

## 2011-05-19 DIAGNOSIS — F70 Mild intellectual disabilities: Secondary | ICD-10-CM

## 2011-05-19 DIAGNOSIS — I890 Lymphedema, not elsewhere classified: Secondary | ICD-10-CM

## 2011-05-19 DIAGNOSIS — Q8711 Prader-Willi syndrome: Secondary | ICD-10-CM

## 2011-05-19 NOTE — Assessment & Plan Note (Signed)
Dependent, juvenile personality level (early grade school) Adds to the difficulty in caregiving

## 2011-05-19 NOTE — Assessment & Plan Note (Signed)
Totally disabled and dependent Suggested they check with social services whether some aide help is available as mom has significant burn out  She really needs help or he may wind up needing placement

## 2011-05-19 NOTE — Assessment & Plan Note (Signed)
Massive--unprecedented in my experience Constant, copious weeping Chronic hypertrophic changes but amazingly no clear infection The smell suggests Pseudomonas colonization  P:  Needs constant changing of gauze. Mom will explore whether insurance will cover. For now, I suggested cleaning with 1/2 strength vinegar (acetic acid) and using bath sheets to cover (so they can be washed)

## 2011-05-19 NOTE — Progress Notes (Signed)
Subjective:    Patient ID: Gordon [name], male    DOB: 19-Jul-1966, 45 y.o.   MRN: [id number]  HPI Seen with mom and cousin Renee  Ulcers continue to get worse Soaks through bandages within hours Constant open sores on both legs  Was giving the furosemide but stopped last week Was getting cramps and refused the med  Has only been upstairs once since my last visit Copious drainage when he stands  Mood has been okay Seems to accept his limitations Has real horror about leaving house still  Has not even tried the lymphedema wraps for a long time "I would ruin it" Can't even pick up his legs  Current Outpatient Prescriptions on File Prior to Visit  Medication Sig Dispense Refill  . ergocalciferol (VITAMIN D2) 50000 UNITS capsule Take 50,000 Units by mouth every 30 (thirty) days.        . furosemide (LASIX) 40 MG tablet Take 1-2 tablets (40-80 mg total) by mouth daily as needed.  60 tablet  11  . hydrocortisone (ANUSOL-HC) 25 MG suppository Place 25 mg rectally 3 (three) times daily as needed. For pain or bleeding       . ibuprofen (ADVIL,MOTRIN) 200 MG tablet Take 200 mg by mouth every 8 (eight) hours as needed.          Not on File  Past Medical History  Diagnosis Date  . Prader-Willi syndrome   . Lymphedema     severe  . Hemorrhoids   . Obesity   . Mental retardation     Past Surgical History  Procedure Date  . Strabismus surgery child    twice but failed    Family History  Problem Relation Age of Onset  . Arthritis Mother   . Heart disease Father   . Aortic aneurysm Father   . Diabetes Maternal Uncle   . Diabetes Maternal Grandmother     History   Social History  . Marital Status: Single    Spouse Name: N/A    Number of Children: N/A  . Years of Education: N/A   Occupational History  . Disabled    Social History Main Topics  . Smoking status: Never Smoker   . Smokeless tobacco: Never Used  . Alcohol Use: No  . Drug Use: Not on file  .  Sexually Active: Not on file   Other Topics Concern  . Not on file   Social History Narrative   Lives with mother who provides his care.Worked at [employer] briefly but that didn't work out   Review of [employer] does bathing in hallway out of sink in bathroom Sleeps okay--"too much" per mom      Objective:   Physical Exam  Constitutional:       Alert, self centered and yelling at mom at times Yells with checking nails and legs  Neck: No thyromegaly present.  Cardiovascular: Normal rate, regular rhythm and normal heart sounds.  Exam reveals no gallop.   No murmur heard. Pulmonary/Chest: Effort normal and breath sounds normal. No respiratory distress. He has no wheezes. He has no rales.  Abdominal: Soft. There is no tenderness.  Musculoskeletal:       Massive edema --worse than before Hypertrophic red areas all over calves---no clear ulcers or infection Mycotic nails (barely visible as toes are buried in edema)  Lymphadenopathy:    He has no cervical adenopathy.  Psychiatric:       Childish and demanding Doesn't seem depressed  Assessment & Plan:

## 2011-05-19 NOTE — Assessment & Plan Note (Signed)
Underlying cause of all above problems

## 2011-05-20 ENCOUNTER — Telehealth: Payer: Self-pay | Admitting: *Deleted

## 2011-05-20 NOTE — Telephone Encounter (Signed)
Gordon [name] has faxed an order for home health, which is on your desk.  She says she thinks pt's mother is ready for some assistance.  Gordon [name] says this form will serve as a face to face.

## 2011-05-20 NOTE — Telephone Encounter (Signed)
Okay to try again Can send my home visit note from 8/15 Form done

## 2011-05-21 NOTE — Telephone Encounter (Signed)
[facility] HH form faxed along with office visit from 05/19/2011.

## 2011-05-28 ENCOUNTER — Telehealth: Payer: Self-pay | Admitting: *Deleted

## 2011-05-28 DIAGNOSIS — I890 Lymphedema, not elsewhere classified: Secondary | ICD-10-CM

## 2011-05-28 DIAGNOSIS — L89899 Pressure ulcer of other site, unspecified stage: Secondary | ICD-10-CM

## 2011-05-28 DIAGNOSIS — L03319 Cellulitis of trunk, unspecified: Secondary | ICD-10-CM

## 2011-05-28 NOTE — Telephone Encounter (Signed)
Nurse is asking for a referral to have wound care for patient, per nurse, pt and family are agreeing to this, patient will be transported outside the home. Please advise.

## 2011-05-31 NOTE — Telephone Encounter (Signed)
Spoke to [facility] nurse Britt Boozer about transportation to [facility] wound clinic. Referral form faxed to [facility] . Will help coordinate the appointment with family Hh nurse and Wound clinic when I find out when the appt will be. Also spoke to Raymond mother to let her know what we were trying to do.

## 2011-06-03 ENCOUNTER — Telehealth: Payer: Self-pay | Admitting: *Deleted

## 2011-06-03 NOTE — Telephone Encounter (Signed)
Not clear that his cramps have anything to do with potassium I think it would be best for him to try an OTC potassium pill to take daily and see if that helps the cramps

## 2011-06-03 NOTE — Telephone Encounter (Signed)
Home health nurse called to report that pt's mother has not given him a fluid pill in about 4 days because he was having severe leg cramps.  Nurse is asking if pt should have a script for potassium supplement.  Uses [facility].

## 2011-06-04 NOTE — Telephone Encounter (Signed)
Per nurse she needs to know how many tablets per day and mcg? I advised OTC and she said that's wouldn't do, I advised that Dr.Letvak is out until Tuesday and per nurse she will give him qd until Tuesday. Please advise

## 2011-06-06 NOTE — Telephone Encounter (Signed)
Okay to send Rx for KCL 20 meq daily #30 x 11 I was making the recommendation of OTC to mother---who could give him one OTC tab daily but go ahead with the Rx

## 2011-06-09 MED ORDER — POTASSIUM CHLORIDE CRYS ER 20 MEQ PO TBCR: 20.0000 meq | EXTENDED_RELEASE_TABLET | Freq: Every day | ORAL | Status: AC

## 2011-06-09 NOTE — Telephone Encounter (Signed)
Spoke with mother and advised results, rx sent to pharmacy by e-script

## 2011-06-14 ENCOUNTER — Encounter: Payer: Self-pay | Admitting: Nurse Practitioner

## 2011-06-14 ENCOUNTER — Encounter: Payer: Self-pay | Admitting: Cardiothoracic Surgery

## 2011-06-15 ENCOUNTER — Telehealth: Payer: Self-pay | Admitting: *Deleted

## 2011-06-15 NOTE — Telephone Encounter (Signed)
Nurse from [facility] is asking if you would call the sores on pt's legs statis ulcers.  Please advise.

## 2011-06-15 NOTE — Telephone Encounter (Signed)
Yes, they are venous stasis ulcers

## 2011-06-15 NOTE — Telephone Encounter (Signed)
FYI Dr Alphonsus Sias, Called to see if patient made it to the [facility]ound Ctr yesterday. He did go to appt by way of EMS. He arrived by 1:30 and left there at 6pm. He is to go back there on 07/17/2011.

## 2011-06-16 NOTE — Telephone Encounter (Signed)
Well, that is good to know First time he has been out of his house for ~2 years I think

## 2011-06-17 DIAGNOSIS — L97909 Non-pressure chronic ulcer of unspecified part of unspecified lower leg with unspecified severity: Secondary | ICD-10-CM

## 2011-06-17 DIAGNOSIS — I890 Lymphedema, not elsewhere classified: Secondary | ICD-10-CM

## 2011-06-17 DIAGNOSIS — E669 Obesity, unspecified: Secondary | ICD-10-CM

## 2011-07-05 ENCOUNTER — Encounter: Payer: Self-pay | Admitting: Nurse Practitioner

## 2011-07-05 ENCOUNTER — Encounter: Payer: Self-pay | Admitting: Cardiothoracic Surgery

## 2011-08-05 ENCOUNTER — Encounter: Payer: Self-pay | Admitting: Nurse Practitioner

## 2011-08-05 ENCOUNTER — Encounter: Payer: Self-pay | Admitting: Cardiothoracic Surgery

## 2011-08-09 ENCOUNTER — Telehealth: Payer: Self-pay | Admitting: *Deleted

## 2011-08-09 NOTE — Telephone Encounter (Signed)
I expect to make it out there in about 2 weeks I will bring his flu shot I will call closer to the time with specific date but will either be Tuesday the 20th or the next week Tuesday or Wednesday. Thanksgiving has affected my home visits that week (usually on Wednesday and I am off that day)

## 2011-08-09 NOTE — Telephone Encounter (Signed)
Mom calling asking when will pt's next visit be? She concerned because he needs a flu shot. Please advise

## 2011-08-10 NOTE — Telephone Encounter (Signed)
Spoke with mom and advised results. 

## 2011-08-17 ENCOUNTER — Ambulatory Visit: Payer: Medicare Other | Admitting: Internal Medicine

## 2011-08-17 ENCOUNTER — Encounter: Payer: Self-pay | Admitting: Internal Medicine

## 2011-08-17 DIAGNOSIS — Q8711 Prader-Willi syndrome: Secondary | ICD-10-CM

## 2011-08-17 DIAGNOSIS — R197 Diarrhea, unspecified: Secondary | ICD-10-CM

## 2011-08-17 DIAGNOSIS — L02219 Cutaneous abscess of trunk, unspecified: Secondary | ICD-10-CM

## 2011-08-17 DIAGNOSIS — L03319 Cellulitis of trunk, unspecified: Secondary | ICD-10-CM

## 2011-08-17 DIAGNOSIS — Z23 Encounter for immunization: Secondary | ICD-10-CM

## 2011-08-17 DIAGNOSIS — F340 Cyclothymic disorder: Secondary | ICD-10-CM

## 2011-08-17 DIAGNOSIS — F39 Unspecified mood [affective] disorder: Secondary | ICD-10-CM

## 2011-08-17 NOTE — Assessment & Plan Note (Signed)
counseled him about letting mom sleep at night He is at most grade school level but understands about going to hospital Counseled him about treating mom as well as possible so she can continue to give him care

## 2011-08-17 NOTE — Patient Instructions (Signed)
Will plan follow up appointment in about 3 months

## 2011-08-17 NOTE — Assessment & Plan Note (Signed)
Underlying disorder for MR and the lymphedema

## 2011-08-17 NOTE — Assessment & Plan Note (Signed)
Has been treated with antibiotics with some improvement Got cipro and cefdinir 2 months ago Now with increased drainage Due for follow up in wound center next month

## 2011-08-17 NOTE — Progress Notes (Signed)
Subjective:    Patient ID: Gordon [name], male    DOB: 13-Jun-1966, 45 y.o.   MRN: [id number]  HPI Gordon [name] twice to wound clinic Requires transport with ambulance and multiple people Has had a couple of different antibiotics ---has decreased some of the green and yellow discharge Mom has hired a Agricultural engineer to help with dressings Cleaning wounds with toothbrush to keep white exudate off Now using absorbant hospital chux to absorb the moisture  Now with loose stools--now 3-4 per day No blood No abdominal pain Eating okay  Now completely bound to chair No longer can walk even to the kitchen Only gets out of the chair to bathroom Aide (man who he gets along with) bathes him 3 times a week  Has terrible nights Yells and keeps mom up over the past couple of months Has really worn his mother out!!  Current Outpatient Prescriptions on File Prior to Visit  Medication Sig Dispense Refill  . ergocalciferol (VITAMIN D2) 50000 UNITS capsule Take 50,000 Units by mouth every 30 (thirty) days.        . furosemide (LASIX) 40 MG tablet Take 1-2 tablets (40-80 mg total) by mouth daily as needed.  60 tablet  11  . hydrocortisone (ANUSOL-HC) 25 MG suppository Place 25 mg rectally 3 (three) times daily as needed. For pain or bleeding       . potassium chloride SA (K-DUR,KLOR-CON) 20 MEQ tablet Take 1 tablet (20 mEq total) by mouth daily.  30 tablet  11    Not on File  Past Medical History  Diagnosis Date  . Prader-Willi syndrome   . Lymphedema     severe  . Hemorrhoids   . Obesity   . Mental retardation     Past Surgical History  Procedure Date  . Strabismus surgery child    twice but failed    Family History  Problem Relation Age of Onset  . Arthritis Mother   . Heart disease Father   . Aortic aneurysm Father   . Diabetes Maternal Uncle   . Diabetes Maternal Grandmother     History   Social History  . Marital Status: Single    Spouse Name: N/A    Number of Children:  N/A  . Years of Education: N/A   Occupational History  . Disabled    Social History Main Topics  . Smoking status: Never Smoker   . Smokeless tobacco: Never Used  . Alcohol Use: No  . Drug Use: Not on file  . Sexually Active: Not on file   Other Topics Concern  . Not on file   Social History Narrative   Lives with mother who provides his care.Worked at [employer] briefly but that didn't work out   Review of Systems No SOB No chest pain Has had some cough but this seems better     Objective:   Physical Exam  Constitutional:       Massive obesity  Engages Immature---at most early grade school level cognitive status  Neck: No thyromegaly present.  Cardiovascular: Normal rate, regular rhythm and normal heart sounds.  Exam reveals no gallop.   No murmur heard. Pulmonary/Chest: Effort normal and breath sounds normal. No respiratory distress. He has no wheezes. He has no rales.  Abdominal: Soft. There is no tenderness.  Musculoskeletal: He exhibits edema.       Massive lymphedema from feet to abdomen  Lymphadenopathy:    He has no cervical adenopathy.  Skin:  Multiple ulcers on legs still Still has hypertrophic red covering Not all examined since dressings in place          Assessment & Plan:

## 2011-08-17 NOTE — Assessment & Plan Note (Signed)
Doesn't seem sick Doubt C dif and appears well Will have mom get probiotic and try that for now

## 2011-08-18 DIAGNOSIS — Z23 Encounter for immunization: Secondary | ICD-10-CM

## 2011-08-18 NOTE — Progress Notes (Signed)
Addended by: Sueanne Margarita on: 08/18/2011 08:06 AM   Modules accepted: Orders

## 2011-08-20 ENCOUNTER — Ambulatory Visit: Payer: Medicare Other | Admitting: Internal Medicine

## 2011-09-09 ENCOUNTER — Other Ambulatory Visit: Payer: Self-pay | Admitting: Internal Medicine

## 2011-10-22 ENCOUNTER — Telehealth: Payer: Self-pay | Admitting: Internal Medicine

## 2011-10-22 DIAGNOSIS — L89899 Pressure ulcer of other site, unspecified stage: Secondary | ICD-10-CM

## 2011-10-22 NOTE — Telephone Encounter (Signed)
Spoke with mom and she states she needs a referral for home health to have someone come out on saturdays. Per Dr.Letvak she would need to call wound care to have home health set-up. Mom states she doesn't have anything to do with wound care except getting bandages from them, they don't help her do anything, she called home health and they need a referral from Dr.Letvak to start coming back out.   I don't know how to get her to understand. Please advise

## 2011-10-22 NOTE — Telephone Encounter (Signed)
I can try to just put in order again for home health for nursing care for weekly dressings for his chronic leg wounds Not sure if she wants [facility] or try another agency this time

## 2011-10-22 NOTE — Telephone Encounter (Signed)
Pt's Mother has called in and is saying that Gordon [name] needs to go to the wound care center and is transported there by ambulance and he is refusing. There is someone that comes out and dresses his wounds and he will not be able to come anymore. She needs someone to help come out and dress his wounds. She was wondering if Home Healthcare could start coming back out to help.

## 2011-10-22 NOTE — Telephone Encounter (Signed)
Spoke with mom and she wait for referral, will forward to [place].

## 2011-10-22 NOTE — Telephone Encounter (Signed)
Spoke with mother and she states she doesn't need dressings she already get those thru wound care. Per mother she need someone to come in on Saturdays to dress his wounds, mother fell down the stairs and also she's going on her 10th week of shingles and she's just not able. Please advise

## 2011-10-22 NOTE — Telephone Encounter (Signed)
Pts wife called back says these are the dates when she need help w/ pt.  January 22nd, 24th 26th, 29th , and Feb 2nd.  Mrs Kluver says she fell down the steps and the person she has helping her now will not be available on the days listed. Call back # [phone]...cdavis

## 2011-10-22 NOTE — Telephone Encounter (Signed)
It would probably be better for her to discuss this with the wound management person--they would have to order the dressings as they wanted them  I do hope to get out there either next Tuesday or Wednesday

## 2011-10-22 NOTE — Telephone Encounter (Signed)
I still mean that if she needs someone to change the dressings, the wound care people should make the referral  I can discuss this further at the home visit but she needs to check with the wound care center about setting up help to do the dressings at home

## 2011-10-26 ENCOUNTER — Encounter: Payer: Self-pay | Admitting: Internal Medicine

## 2011-10-26 ENCOUNTER — Ambulatory Visit: Payer: Medicare Other | Admitting: Internal Medicine

## 2011-10-26 DIAGNOSIS — J069 Acute upper respiratory infection, unspecified: Secondary | ICD-10-CM | POA: Insufficient documentation

## 2011-10-26 DIAGNOSIS — L97209 Non-pressure chronic ulcer of unspecified calf with unspecified severity: Secondary | ICD-10-CM

## 2011-10-26 DIAGNOSIS — L02219 Cutaneous abscess of trunk, unspecified: Secondary | ICD-10-CM

## 2011-10-26 MED ORDER — CEFDINIR 300 MG PO CAPS: 300.0000 mg | ORAL_CAPSULE | Freq: Two times a day (BID) | ORAL | Status: AC

## 2011-10-26 MED ORDER — CIPROFLOXACIN HCL 500 MG PO TABS: 500.0000 mg | ORAL_TABLET | Freq: Two times a day (BID) | ORAL | Status: AC

## 2011-10-26 NOTE — Assessment & Plan Note (Signed)
Seems to be viral Will be giving cephalosporin so should prevent secondary bacterial infection anyway

## 2011-10-26 NOTE — Assessment & Plan Note (Signed)
Had been doing well with regular dressing changes Mom is being helped by Arthur---aide who comes in daily and is here now  Since he has increased drainage, will restart the same antibiotics (unable to do culture here today)

## 2011-10-26 NOTE — Progress Notes (Signed)
Subjective:    Patient ID: Gordon [name], male    DOB: 1965/10/06, 46 y.o.   MRN: [id number]  HPI Spoke to Randa Evens at [facility] wound clinic He is not able to get back out to them They have been orderintg supplies via [facility] but they needs measurements of the size of his wounds every 3 months I will have to do this today Discussed that no home health is allowed to open his care due to chronic condition that is not expected to improve They have treated his wounds for infection Last had Staph and Pseudomonas----rx with omnicef and cipro Her # is [phone]  Discussed the lymphedema pump Can't use this unless he uses hose after---or can make things worse He is using the furosemide regularly---this may help some  Has had worsening of the smell and discharge Generally the wounds have only needed to be dressed daily---had been worse before last antibiotics  Has had a hacking cough No fever No SOB Cough is mostly dry--occ white phlegm with slight yellow Some wheezing  Gets up only to go to kitchen and bathroom  Having daily dressing changes by aide Arthur---who is here now  Current Outpatient Prescriptions on File Prior to Visit  Medication Sig Dispense Refill  . furosemide (LASIX) 40 MG tablet Take 1-2 tablets (40-80 mg total) by mouth daily as needed.  60 tablet  11  . hydrocortisone (ANUSOL-HC) 25 MG suppository Place 25 mg rectally 3 (three) times daily as needed. For pain or bleeding       . potassium chloride SA (K-DUR,KLOR-CON) 20 MEQ tablet Take 1 tablet (20 mEq total) by mouth daily.  30 tablet  11  . Vitamin D, Ergocalciferol, (DRISDOL) 50000 UNITS CAPS TAKE ONE CAPSULE EACH MONTH  12 capsule  0    Not on File  Past Medical History  Diagnosis Date  . Prader-Willi syndrome   . Lymphedema     severe  . Hemorrhoids   . Obesity   . Mental retardation     Past Surgical History  Procedure Date  . Strabismus surgery child    twice but failed    Family History    Problem Relation Age of Onset  . Arthritis Mother   . Heart disease Father   . Aortic aneurysm Father   . Diabetes Maternal Uncle   . Diabetes Maternal Grandmother     History   Social History  . Marital Status: Single    Spouse Name: N/A    Number of Children: N/A  . Years of Education: N/A   Occupational History  . Disabled    Social History Main Topics  . Smoking status: Never Smoker   . Smokeless tobacco: Never Used  . Alcohol Use: No  . Drug Use: Not on file  . Sexually Active: Not on file   Other Topics Concern  . Not on file   Social History Narrative   Lives with mother who provides his care.Worked at [employer] briefly but that didn't work out   Review of Systems occ headaches---but not lately Appetite is okay Sleeps okay---in recliner Has been scratching at his arms again--now has inflamed ulcers there    Objective:   Physical Exam  Constitutional: He appears well-developed. No distress.  Neck: Normal range of motion.  Cardiovascular: Normal rate, regular rhythm and normal heart sounds.  Exam reveals no gallop.   No murmur heard. Pulmonary/Chest: Effort normal and breath sounds normal. No respiratory distress. He has no wheezes. He has  no rales.  Abdominal: Soft. There is no tenderness.  Musculoskeletal: He exhibits edema.       Massive lymphedema still ?slightly better than last time  Lymphadenopathy:    He has no cervical adenopathy.  Neurological:       Able to walk slowly to kitchen  Skin:       Ulcers now only on calf---not up to knee Vastly improved from before the wound center Does have some discharge and smell  Has excoriated ulcers on both arms with surrounding redness  Psychiatric:       Usual affect Child like emotional state and lack of insight/judgement          Assessment & Plan:

## 2011-10-26 NOTE — Assessment & Plan Note (Signed)
Ongoing emotional issues due to Prader-Willi Mom seems to be doing better with the daily help for dressing changes

## 2011-10-26 NOTE — Assessment & Plan Note (Signed)
Widespread over calves  Approximate measurements are: For left calf--- 40x25cm along front and side                          15x10 cm for foot                          20x 30 along back   For right calf----  40x20cm along side

## 2011-11-04 ENCOUNTER — Other Ambulatory Visit: Payer: Self-pay | Admitting: Internal Medicine

## 2012-01-12 ENCOUNTER — Ambulatory Visit: Payer: Medicare Other | Admitting: Internal Medicine

## 2012-01-12 ENCOUNTER — Encounter: Payer: Self-pay | Admitting: Internal Medicine

## 2012-01-12 VITALS — BP 108/60 | HR 72 | Resp 18

## 2012-01-12 DIAGNOSIS — Q8711 Prader-Willi syndrome: Secondary | ICD-10-CM

## 2012-01-12 DIAGNOSIS — J069 Acute upper respiratory infection, unspecified: Secondary | ICD-10-CM

## 2012-01-12 DIAGNOSIS — I890 Lymphedema, not elsewhere classified: Secondary | ICD-10-CM

## 2012-01-12 DIAGNOSIS — I83009 Varicose veins of unspecified lower extremity with ulcer of unspecified site: Secondary | ICD-10-CM

## 2012-01-12 DIAGNOSIS — L97909 Non-pressure chronic ulcer of unspecified part of unspecified lower leg with unspecified severity: Secondary | ICD-10-CM | POA: Insufficient documentation

## 2012-01-12 DIAGNOSIS — F340 Cyclothymic disorder: Secondary | ICD-10-CM

## 2012-01-12 MED ORDER — FLUOXETINE HCL 10 MG PO CAPS: 10.0000 mg | ORAL_CAPSULE | Freq: Every day | ORAL | Status: AC

## 2012-01-12 NOTE — Assessment & Plan Note (Signed)
He is concerned about cold but it may be allergies Discussed trying loratadine with mom

## 2012-01-12 NOTE — Assessment & Plan Note (Addendum)
Has developmental delay due to Jeanella Cara Stuck basically at preschool level Mom has done better having daily help---both to handle wounds and some companionship. She gets exasperated  Really has changed ---anhedonia and never happy (as he was in the past) Will try low dose fluoxetine to see if it helps

## 2012-01-12 NOTE — Progress Notes (Signed)
Subjective:    Patient ID: Gordon [name], male    DOB: 04-07-1966, 46 y.o.   MRN: [id number]  HPI Seen with Mom as usual  Still has help with his dressings---Arthur and Alden Server (aide in pain management center) One of them comes daily Still getting dressings via wound clinic Needs measurements again Constant drainage and bad smell---no sig change  Spends almost all his time in recliner Goes into kitchen for his wound dressing changes Voids in cup standing at chair---mom has to hold cup Stools in cammode but with mom's help  Has had a hacking cough No fever "no energy" ---but hard to tell what is his usual lethargy Has phlegm in throat Not known to have spring allergies  Mood is "ill" today---he has intermittent mood issues Like screaming and cursing when TV went out  Current Outpatient Prescriptions on File Prior to Visit  Medication Sig Dispense Refill  . furosemide (LASIX) 40 MG tablet Take 1-2 tablets (40-80 mg total) by mouth daily as needed.  60 tablet  11  . hydrocortisone (ANUSOL-HC) 25 MG suppository Place 25 mg rectally 3 (three) times daily as needed. For pain or bleeding       . potassium chloride SA (K-DUR,KLOR-CON) 20 MEQ tablet Take 1 tablet (20 mEq total) by mouth daily.  30 tablet  11  . Vitamin D, Ergocalciferol, (DRISDOL) 50000 UNITS CAPS TAKE ONE CAPSULE EACH MONTH  12 capsule  0    Not on File  Past Medical History  Diagnosis Date  . Prader-Willi syndrome   . Lymphedema     severe  . Hemorrhoids   . Obesity   . Mental retardation     Past Surgical History  Procedure Date  . Strabismus surgery child    twice but failed    Family History  Problem Relation Age of Onset  . Arthritis Mother   . Heart disease Father   . Aortic aneurysm Father   . Diabetes Maternal Uncle   . Diabetes Maternal Grandmother     History   Social History  . Marital Status: Single    Spouse Name: N/A    Number of Children: N/A  . Years of Education: N/A     Occupational History  . Disabled    Social History Main Topics  . Smoking status: Never Smoker   . Smokeless tobacco: Never Used  . Alcohol Use: No  . Drug Use: Not on file  . Sexually Active: Not on file   Other Topics Concern  . Not on file   Social History Narrative   Lives with mother who provides his care.Worked at [employer] briefly but that didn't work out   Review of Systems 1 episode of rectal bleeding last week--not frequent Still eats well--mom has to regulate because he would eat constantly    Objective:   Physical Exam  Cardiovascular: Normal rate, regular rhythm and normal heart sounds.  Exam reveals no gallop.   No murmur heard. Pulmonary/Chest: Effort normal and breath sounds normal. No respiratory distress. He has no wheezes. He has no rales.  Abdominal: Soft. There is no tenderness.  Musculoskeletal:       Massive lymphedema basically unchanged  Lymphadenopathy:    He has no cervical adenopathy.  Neurological:       Able to stand when lift chair far forward---takes quite a bit of effort and strain on his part  Skin:       ULcers Left lateral calf-- ~25x15cm Left medial calf  10x5cm Right posterior to lateral calf ~35x 15 Rightt medial to posterior ~15x15cm 2 smaller areas on anterior right calf--- 5x5cm x 2 Dorsum of right foot ~15x10cm Smaller area on left foot  Mild discharge but not inflamed  Psychiatric:       Childish, temper tantrums---opposing mom's wishes           Assessment & Plan:

## 2012-01-12 NOTE — Assessment & Plan Note (Signed)
No easy answers for this Continues with daily wound care Dressing via Randa Evens at wound clinic

## 2012-01-12 NOTE — Progress Notes (Signed)
Addended by: Tillman Abide I on: 01/12/2012 03:08 PM   Modules accepted: Orders

## 2012-01-12 NOTE — Assessment & Plan Note (Signed)
underlying disorder causing the lymphedema, obesity and mental retardation

## 2012-01-12 NOTE — Assessment & Plan Note (Signed)
Better with the dressings Hypertrophic changes Doesn't look infected

## 2012-02-21 ENCOUNTER — Inpatient Hospital Stay: Payer: Self-pay | Admitting: Internal Medicine

## 2012-02-21 LAB — URINALYSIS, COMPLETE
Bilirubin,UR: NEGATIVE
Leukocyte Esterase: NEGATIVE
Ph: 6 (ref 4.5–8.0)
Protein: NEGATIVE
RBC,UR: 1 /HPF (ref 0–5)
Specific Gravity: 1.009 (ref 1.003–1.030)
Squamous Epithelial: <1
WBC UR: 6 /HPF (ref 0–5)

## 2012-02-21 LAB — TSH: Thyroid Stimulating Horm: 63.4 u[IU]/mL — ABNORMAL HIGH

## 2012-02-21 LAB — COMPREHENSIVE METABOLIC PANEL
Bilirubin,Total: 0.4 mg/dL (ref 0.2–1.0)
Chloride: 96 mmol/L — ABNORMAL LOW (ref 98–107)
Co2: 38 mmol/L — ABNORMAL HIGH (ref 21–32)
Creatinine: 0.99 mg/dL (ref 0.60–1.30)
EGFR (Non-African Amer.): >60
Glucose: 162 mg/dL — ABNORMAL HIGH (ref 65–99)
Osmolality: 285 (ref 275–301)
Potassium: 4.1 mmol/L (ref 3.5–5.1)
SGOT(AST): 45 U/L — ABNORMAL HIGH (ref 15–37)
SGPT (ALT): 37 U/L
Total Protein: 7.6 g/dL (ref 6.4–8.2)

## 2012-02-21 LAB — CBC WITH DIFFERENTIAL/PLATELET
Basophil #: 0 10*3/uL (ref 0.0–0.1)
Eosinophil #: 0.3 10*3/uL (ref 0.0–0.7)
Eosinophil %: 2.9 %
HGB: 13.1 g/dL (ref 13.0–18.0)
Lymphocyte #: 1.5 10*3/uL (ref 1.0–3.6)
Lymphocyte %: 16.7 %
MCH: 28 pg (ref 26.0–34.0)
MCHC: 31.1 g/dL — ABNORMAL LOW (ref 32.0–36.0)
Monocyte #: 0.9 x10 3/mm (ref 0.2–1.0)
Neutrophil %: 70 %
RBC: 4.69 10*6/uL (ref 4.40–5.90)
RDW: 17.6 % — ABNORMAL HIGH (ref 11.5–14.5)

## 2012-02-21 LAB — HEMOGLOBIN A1C: Hemoglobin A1C: 7.3 % — ABNORMAL HIGH (ref 4.2–6.3)

## 2012-02-22 LAB — LIPID PANEL
Cholesterol: 111 mg/dL (ref 0–200)
HDL Cholesterol: 46 mg/dL (ref 40–60)
Ldl Cholesterol, Calc: 48 mg/dL (ref 0–100)
VLDL Cholesterol, Calc: 17 mg/dL (ref 5–40)

## 2012-02-22 LAB — CBC WITH DIFFERENTIAL/PLATELET
HCT: 39.2 % — ABNORMAL LOW (ref 40.0–52.0)
Lymphocyte #: 1.1 10*3/uL (ref 1.0–3.6)
Lymphocyte %: 13.7 %
MCH: 27.8 pg (ref 26.0–34.0)
MCHC: 31.1 g/dL — ABNORMAL LOW (ref 32.0–36.0)
MCV: 89 fL (ref 80–100)
Neutrophil #: 5.8 10*3/uL (ref 1.4–6.5)
Neutrophil %: 72.1 %
Platelet: 172 10*3/uL (ref 150–440)
RBC: 4.39 10*6/uL — ABNORMAL LOW (ref 4.40–5.90)
RDW: 17.9 % — ABNORMAL HIGH (ref 11.5–14.5)
WBC: 8 10*3/uL (ref 3.8–10.6)

## 2012-02-22 LAB — COMPREHENSIVE METABOLIC PANEL
Albumin: 3 g/dL — ABNORMAL LOW (ref 3.4–5.0)
Alkaline Phosphatase: 61 U/L (ref 50–136)
Calcium, Total: 8.1 mg/dL — ABNORMAL LOW (ref 8.5–10.1)
Chloride: 96 mmol/L — ABNORMAL LOW (ref 98–107)
Co2: 37 mmol/L — ABNORMAL HIGH (ref 21–32)
EGFR (African American): >60
EGFR (Non-African Amer.): >60
Glucose: 115 mg/dL — ABNORMAL HIGH (ref 65–99)
Osmolality: 280 (ref 275–301)
Potassium: 3.3 mmol/L — ABNORMAL LOW (ref 3.5–5.1)
SGOT(AST): 34 U/L (ref 15–37)
SGPT (ALT): 32 U/L
Total Protein: 7.3 g/dL (ref 6.4–8.2)

## 2012-02-22 LAB — MAGNESIUM: Magnesium: 1.8 mg/dL

## 2012-02-23 LAB — CREATININE, SERUM
Creatinine: 0.73 mg/dL (ref 0.60–1.30)
EGFR (Non-African Amer.): >60

## 2012-02-24 ENCOUNTER — Telehealth: Payer: Self-pay | Admitting: Internal Medicine

## 2012-02-24 LAB — BASIC METABOLIC PANEL
Calcium, Total: 8.6 mg/dL (ref 8.5–10.1)
Chloride: 90 mmol/L — ABNORMAL LOW (ref 98–107)
Co2: 44 mmol/L (ref 21–32)
Creatinine: 0.73 mg/dL (ref 0.60–1.30)
EGFR (Non-African Amer.): >60
Glucose: 110 mg/dL — ABNORMAL HIGH (ref 65–99)
Osmolality: 279 (ref 275–301)
Potassium: 3.5 mmol/L (ref 3.5–5.1)

## 2012-02-24 LAB — HEMOGLOBIN: HGB: 12.4 g/dL — ABNORMAL LOW (ref 13.0–18.0)

## 2012-02-24 LAB — URINE CULTURE

## 2012-02-24 NOTE — Telephone Encounter (Signed)
[facility] had called earlier today and scheduled a follow up from the hospital for patient on 02/29/12.  I called and left a message on patient's answering machine to let his mother know I cancelled the appointment because Dr.Letvak said patient wouldn't be physically able to come into the office.  I let her know that Dr.Letvak would call her next week.

## 2012-02-25 LAB — BASIC METABOLIC PANEL
Calcium, Total: 8.7 mg/dL (ref 8.5–10.1)
Chloride: 93 mmol/L — ABNORMAL LOW (ref 98–107)
Creatinine: 0.71 mg/dL (ref 0.60–1.30)
EGFR (African American): >60
EGFR (Non-African Amer.): >60
Glucose: 133 mg/dL — ABNORMAL HIGH (ref 65–99)
Potassium: 3.7 mmol/L (ref 3.5–5.1)

## 2012-02-25 LAB — URINALYSIS, COMPLETE
Bilirubin,UR: NEGATIVE
Blood: NEGATIVE
Hyaline Cast: 2
Ketone: NEGATIVE
Leukocyte Esterase: NEGATIVE
Nitrite: NEGATIVE
Protein: 30
RBC,UR: 70 /HPF (ref 0–5)
Squamous Epithelial: 4
WBC UR: 3 /HPF (ref 0–5)

## 2012-02-25 LAB — CBC WITH DIFFERENTIAL/PLATELET
Basophil #: 0 10*3/uL (ref 0.0–0.1)
Basophil %: 0.5 %
Eosinophil %: 6.6 %
HCT: 42.1 % (ref 40.0–52.0)
Lymphocyte #: 0.8 10*3/uL — ABNORMAL LOW (ref 1.0–3.6)
Lymphocyte %: 10.7 %
MCHC: 29.7 g/dL — ABNORMAL LOW (ref 32.0–36.0)
Neutrophil #: 5.2 10*3/uL (ref 1.4–6.5)
Neutrophil %: 70.6 %
Platelet: 174 10*3/uL (ref 150–440)
RDW: 17.6 % — ABNORMAL HIGH (ref 11.5–14.5)
WBC: 7.3 10*3/uL (ref 3.8–10.6)

## 2012-02-26 LAB — CULTURE, BLOOD (SINGLE)

## 2012-02-27 LAB — WOUND AEROBIC CULTURE

## 2012-02-29 ENCOUNTER — Ambulatory Visit: Payer: Medicare Other | Admitting: Internal Medicine

## 2012-02-29 NOTE — Telephone Encounter (Signed)
Spoke with mom Has been transferred to [facility] health care center Seems to be doing well there  Will let the medical team there assume his care

## 2012-03-30 ENCOUNTER — Emergency Department: Payer: Self-pay | Admitting: Emergency Medicine

## 2012-03-30 LAB — TSH: Thyroid Stimulating Horm: 16.8 u[IU]/mL — ABNORMAL HIGH

## 2012-03-30 LAB — URINALYSIS, COMPLETE
Bacteria: NONE SEEN
Bilirubin,UR: NEGATIVE
Glucose,UR: NEGATIVE mg/dL (ref 0–75)
Nitrite: NEGATIVE
Ph: 7 (ref 4.5–8.0)
Protein: NEGATIVE
RBC,UR: 5 /HPF (ref 0–5)
Specific Gravity: 1.011 (ref 1.003–1.030)
Squamous Epithelial: 1

## 2012-03-30 LAB — DRUG SCREEN, URINE
Amphetamines, Ur Screen: NEGATIVE (ref ?–1000)
Benzodiazepine, Ur Scrn: NEGATIVE (ref ?–200)
Cannabinoid 50 Ng, Ur ~~LOC~~: NEGATIVE (ref ?–50)
Cocaine Metabolite,Ur ~~LOC~~: NEGATIVE (ref ?–300)
MDMA (Ecstasy)Ur Screen: NEGATIVE (ref ?–500)
Opiate, Ur Screen: NEGATIVE (ref ?–300)
Phencyclidine (PCP) Ur S: NEGATIVE (ref ?–25)
Tricyclic, Ur Screen: NEGATIVE (ref ?–1000)

## 2012-03-30 LAB — ETHANOL
Ethanol %: <0.003 % (ref 0.000–0.080)
Ethanol: <3 mg/dL

## 2012-03-30 LAB — CBC
HCT: 37.5 % — ABNORMAL LOW (ref 40.0–52.0)
HGB: 11.9 g/dL — ABNORMAL LOW (ref 13.0–18.0)
Platelet: 145 10*3/uL — ABNORMAL LOW (ref 150–440)
RBC: 4.14 10*6/uL — ABNORMAL LOW (ref 4.40–5.90)
WBC: 7.1 10*3/uL (ref 3.8–10.6)

## 2012-03-30 LAB — COMPREHENSIVE METABOLIC PANEL
Alkaline Phosphatase: 89 U/L (ref 50–136)
Bilirubin,Total: 0.3 mg/dL (ref 0.2–1.0)
Calcium, Total: 8.9 mg/dL (ref 8.5–10.1)
Creatinine: 0.55 mg/dL — ABNORMAL LOW (ref 0.60–1.30)
Glucose: 80 mg/dL (ref 65–99)
SGPT (ALT): 46 U/L
Total Protein: 6.8 g/dL (ref 6.4–8.2)

## 2012-03-30 LAB — SALICYLATE LEVEL: Salicylates, Serum: <1.7 mg/dL

## 2013-09-30 ENCOUNTER — Inpatient Hospital Stay: Payer: Self-pay | Admitting: Student

## 2013-09-30 LAB — COMPREHENSIVE METABOLIC PANEL
Albumin: 2.6 g/dL — ABNORMAL LOW (ref 3.4–5.0)
BUN: 24 mg/dL — ABNORMAL HIGH (ref 7–18)
Bilirubin,Total: 0.3 mg/dL (ref 0.2–1.0)
Chloride: 92 mmol/L — ABNORMAL LOW (ref 98–107)
Creatinine: 0.71 mg/dL (ref 0.60–1.30)
EGFR (African American): >60
EGFR (Non-African Amer.): >60
Osmolality: 282 (ref 275–301)
Potassium: 4.4 mmol/L (ref 3.5–5.1)
SGPT (ALT): 583 U/L — ABNORMAL HIGH (ref 12–78)
Sodium: 138 mmol/L (ref 136–145)

## 2013-09-30 LAB — CBC
HGB: 14.2 g/dL (ref 13.0–18.0)
MCH: 26.6 pg (ref 26.0–34.0)
MCHC: 30.6 g/dL — ABNORMAL LOW (ref 32.0–36.0)
MCV: 87 fL (ref 80–100)
Platelet: 159 10*3/uL (ref 150–440)
RDW: 18.2 % — ABNORMAL HIGH (ref 11.5–14.5)
WBC: 12.1 10*3/uL — ABNORMAL HIGH (ref 3.8–10.6)

## 2013-09-30 LAB — URINALYSIS, COMPLETE
Bacteria: NONE SEEN
Bilirubin,UR: NEGATIVE
Blood: NEGATIVE
Nitrite: NEGATIVE
Ph: 6 (ref 4.5–8.0)
Protein: 100
Squamous Epithelial: 7

## 2013-09-30 LAB — PRO B NATRIURETIC PEPTIDE: B-Type Natriuretic Peptide: 7021 pg/mL — ABNORMAL HIGH (ref 0–125)

## 2013-09-30 LAB — TROPONIN I: Troponin-I: 0.03 ng/mL

## 2013-09-30 LAB — CK TOTAL AND CKMB (NOT AT ARMC)
CK, Total: 24 U/L — ABNORMAL LOW (ref 35–232)
CK-MB: 1.9 ng/mL (ref 0.5–3.6)

## 2013-10-01 LAB — CBC WITH DIFFERENTIAL/PLATELET
Basophil #: 0 10*3/uL (ref 0.0–0.1)
Eosinophil #: 0.4 10*3/uL (ref 0.0–0.7)
Eosinophil %: 4 %
HCT: 46.4 % (ref 40.0–52.0)
HGB: 14.2 g/dL (ref 13.0–18.0)
Lymphocyte %: 11.6 %
MCH: 26.4 pg (ref 26.0–34.0)
MCHC: 30.5 g/dL — ABNORMAL LOW (ref 32.0–36.0)
MCV: 87 fL (ref 80–100)
Monocyte #: 0.7 x10 3/mm (ref 0.2–1.0)
Monocyte %: 6.6 %
Neutrophil #: 8.5 10*3/uL — ABNORMAL HIGH (ref 1.4–6.5)
Neutrophil %: 77.5 %
Platelet: 159 10*3/uL (ref 150–440)
RDW: 18.5 % — ABNORMAL HIGH (ref 11.5–14.5)
WBC: 11 10*3/uL — ABNORMAL HIGH (ref 3.8–10.6)

## 2013-10-01 LAB — BASIC METABOLIC PANEL
BUN: 22 mg/dL — ABNORMAL HIGH (ref 7–18)
Chloride: 93 mmol/L — ABNORMAL LOW (ref 98–107)
Co2: 45 mmol/L (ref 21–32)
EGFR (African American): >60
Glucose: 133 mg/dL — ABNORMAL HIGH (ref 65–99)
Osmolality: 279 (ref 275–301)
Potassium: 3.7 mmol/L (ref 3.5–5.1)
Sodium: 137 mmol/L (ref 136–145)

## 2013-10-01 LAB — TROPONIN I
Troponin-I: 0.03 ng/mL
Troponin-I: <0.02 ng/mL

## 2013-10-01 LAB — CK TOTAL AND CKMB (NOT AT ARMC): CK-MB: 1.3 ng/mL (ref 0.5–3.6)

## 2013-10-01 LAB — CK-MB: CK-MB: 1.5 ng/mL (ref 0.5–3.6)

## 2013-10-02 LAB — COMPREHENSIVE METABOLIC PANEL
Albumin: 2.6 g/dL — ABNORMAL LOW (ref 3.4–5.0)
Alkaline Phosphatase: 79 U/L
Anion Gap: 7 (ref 7–16)
Bilirubin,Total: 0.4 mg/dL (ref 0.2–1.0)
Calcium, Total: 8.9 mg/dL (ref 8.5–10.1)
Chloride: 86 mmol/L — ABNORMAL LOW (ref 98–107)
Creatinine: 0.58 mg/dL — ABNORMAL LOW (ref 0.60–1.30)
EGFR (African American): >60
Glucose: 139 mg/dL — ABNORMAL HIGH (ref 65–99)
Osmolality: 278 (ref 275–301)
Potassium: 3.5 mmol/L (ref 3.5–5.1)
SGOT(AST): 177 U/L — ABNORMAL HIGH (ref 15–37)
SGPT (ALT): 421 U/L — ABNORMAL HIGH (ref 12–78)
Sodium: 137 mmol/L (ref 136–145)
Total Protein: 6.5 g/dL (ref 6.4–8.2)

## 2013-10-04 ENCOUNTER — Ambulatory Visit: Payer: Self-pay | Admitting: Internal Medicine

## 2013-10-04 LAB — PLATELET COUNT: Platelet: 133 10*3/uL — ABNORMAL LOW (ref 150–440)

## 2013-10-05 LAB — CK: CK, TOTAL: 24 U/L — AB (ref 35–232)

## 2013-10-07 LAB — CREATININE, SERUM
Creatinine: 0.41 mg/dL — ABNORMAL LOW (ref 0.60–1.30)
EGFR (Non-African Amer.): >60

## 2013-10-07 LAB — PLATELET COUNT: Platelet: 158 10*3/uL (ref 150–440)

## 2013-10-17 ENCOUNTER — Inpatient Hospital Stay: Payer: Self-pay | Admitting: Internal Medicine

## 2013-10-17 LAB — URINALYSIS, COMPLETE
BILIRUBIN, UR: NEGATIVE
Bacteria: NONE SEEN
GLUCOSE, UR: NEGATIVE mg/dL (ref 0–75)
Ketone: NEGATIVE
LEUKOCYTE ESTERASE: NEGATIVE
NITRITE: NEGATIVE
PH: 6 (ref 4.5–8.0)
Protein: 30
RBC,UR: 2 /HPF (ref 0–5)
Specific Gravity: 1.01 (ref 1.003–1.030)
Squamous Epithelial: <1

## 2013-10-17 LAB — CBC WITH DIFFERENTIAL/PLATELET
BASOS ABS: 0 10*3/uL (ref 0.0–0.1)
BASOS PCT: 0.3 %
Basophil #: 0 10*3/uL (ref 0.0–0.1)
Basophil %: 0.3 %
EOS ABS: 0.2 10*3/uL (ref 0.0–0.7)
Eosinophil #: 0.3 10*3/uL (ref 0.0–0.7)
Eosinophil %: 2 %
Eosinophil %: 3 %
HCT: 46.6 % (ref 40.0–52.0)
HCT: 44.7 % (ref 40.0–52.0)
HGB: 14.3 g/dL (ref 13.0–18.0)
HGB: 13.9 g/dL (ref 13.0–18.0)
LYMPHS ABS: 1.4 10*3/uL (ref 1.0–3.6)
LYMPHS PCT: 15.2 %
Lymphocyte #: 1.6 10*3/uL (ref 1.0–3.6)
Lymphocyte %: 13.3 %
MCH: 26.9 pg (ref 26.0–34.0)
MCH: 27.3 pg (ref 26.0–34.0)
MCHC: 30.8 g/dL — ABNORMAL LOW (ref 32.0–36.0)
MCHC: 31.1 g/dL — ABNORMAL LOW (ref 32.0–36.0)
MCV: 87 fL (ref 80–100)
MCV: 88 fL (ref 80–100)
MONOS PCT: 7.7 %
Monocyte #: 0.9 x10 3/mm (ref 0.2–1.0)
Monocyte #: 0.8 x10 3/mm (ref 0.2–1.0)
Monocyte %: 8.4 %
NEUTROS PCT: 74.1 %
Neutrophil #: 7.6 10*3/uL — ABNORMAL HIGH (ref 1.4–6.5)
Neutrophil #: 8 10*3/uL — ABNORMAL HIGH (ref 1.4–6.5)
Neutrophil %: 75.7 %
Platelet: 171 10*3/uL (ref 150–440)
Platelet: 158 10*3/uL (ref 150–440)
RBC: 5.33 10*6/uL (ref 4.40–5.90)
RBC: 5.1 10*6/uL (ref 4.40–5.90)
RDW: 18.3 % — ABNORMAL HIGH (ref 11.5–14.5)
RDW: 18.3 % — ABNORMAL HIGH (ref 11.5–14.5)
WBC: 10.2 10*3/uL (ref 3.8–10.6)
WBC: 10.5 10*3/uL (ref 3.8–10.6)

## 2013-10-17 LAB — COMPREHENSIVE METABOLIC PANEL
Albumin: 3.2 g/dL — ABNORMAL LOW (ref 3.4–5.0)
Alkaline Phosphatase: 85 U/L
BUN: 27 mg/dL — ABNORMAL HIGH (ref 7–18)
Bilirubin,Total: 0.2 mg/dL (ref 0.2–1.0)
Calcium, Total: 9 mg/dL (ref 8.5–10.1)
Chloride: 89 mmol/L — ABNORMAL LOW (ref 98–107)
Co2: >45 mmol/L (ref 21–32)
Creatinine: 0.81 mg/dL (ref 0.60–1.30)
EGFR (Non-African Amer.): >60
GLUCOSE: 142 mg/dL — AB (ref 65–99)
Osmolality: 279 (ref 275–301)
POTASSIUM: 4.4 mmol/L (ref 3.5–5.1)
SGOT(AST): 32 U/L (ref 15–37)
SGPT (ALT): 27 U/L (ref 12–78)
Sodium: 136 mmol/L (ref 136–145)
Total Protein: 7.8 g/dL (ref 6.4–8.2)

## 2013-10-17 LAB — APTT: Activated PTT: 27.5 secs (ref 23.6–35.9)

## 2013-10-17 LAB — PROTIME-INR
INR: 1.1
PROTHROMBIN TIME: 13.7 s (ref 11.5–14.7)

## 2013-10-17 LAB — CK TOTAL AND CKMB (NOT AT ARMC)
CK, Total: 21 U/L — ABNORMAL LOW (ref 35–232)
CK-MB: 0.8 ng/mL (ref 0.5–3.6)

## 2013-10-17 LAB — TROPONIN I: Troponin-I: 0.04 ng/mL

## 2013-10-17 LAB — PRO B NATRIURETIC PEPTIDE: B-Type Natriuretic Peptide: 14713 pg/mL — ABNORMAL HIGH (ref 0–125)

## 2013-10-18 LAB — BASIC METABOLIC PANEL
BUN: 24 mg/dL — AB (ref 7–18)
Calcium, Total: 9.1 mg/dL (ref 8.5–10.1)
Chloride: 88 mmol/L — ABNORMAL LOW (ref 98–107)
Co2: >45 mmol/L (ref 21–32)
Creatinine: 0.59 mg/dL — ABNORMAL LOW (ref 0.60–1.30)
EGFR (African American): >60
EGFR (Non-African Amer.): >60
Glucose: 174 mg/dL — ABNORMAL HIGH (ref 65–99)
OSMOLALITY: 286 (ref 275–301)
Potassium: 4.2 mmol/L (ref 3.5–5.1)
SODIUM: 139 mmol/L (ref 136–145)

## 2013-10-18 LAB — APTT

## 2013-10-18 LAB — CBC WITH DIFFERENTIAL/PLATELET
BASOS ABS: 0 10*3/uL (ref 0.0–0.1)
BASOS PCT: 0.1 %
Eosinophil #: 0 10*3/uL (ref 0.0–0.7)
Eosinophil %: 0 %
HCT: 43.4 % (ref 40.0–52.0)
HGB: 13.2 g/dL (ref 13.0–18.0)
LYMPHS PCT: 8.5 %
Lymphocyte #: 0.7 10*3/uL — ABNORMAL LOW (ref 1.0–3.6)
MCH: 26.7 pg (ref 26.0–34.0)
MCHC: 30.5 g/dL — AB (ref 32.0–36.0)
MCV: 88 fL (ref 80–100)
Monocyte #: 0.1 x10 3/mm — ABNORMAL LOW (ref 0.2–1.0)
Monocyte %: 1.2 %
Neutrophil #: 7.2 10*3/uL — ABNORMAL HIGH (ref 1.4–6.5)
Neutrophil %: 90.2 %
PLATELETS: 146 10*3/uL — AB (ref 150–440)
RBC: 4.95 10*6/uL (ref 4.40–5.90)
RDW: 17.7 % — AB (ref 11.5–14.5)
WBC: 8 10*3/uL (ref 3.8–10.6)

## 2013-10-19 LAB — APTT
Activated PTT: 123.1 secs — ABNORMAL HIGH (ref 23.6–35.9)
Activated PTT: 90.8 s — ABNORMAL HIGH

## 2013-10-20 LAB — APTT: Activated PTT: 87.5 secs — ABNORMAL HIGH (ref 23.6–35.9)

## 2013-10-20 LAB — PLATELET COUNT: Platelet: 125 10*3/uL — ABNORMAL LOW (ref 150–440)

## 2013-10-20 LAB — HEMOGLOBIN: HGB: 13.3 g/dL (ref 13.0–18.0)

## 2013-10-21 LAB — APTT: ACTIVATED PTT: 99.4 s — AB (ref 23.6–35.9)

## 2013-10-22 LAB — CBC WITH DIFFERENTIAL/PLATELET
BASOS ABS: 0 10*3/uL (ref 0.0–0.1)
Basophil %: 0.2 %
EOS ABS: 0 10*3/uL (ref 0.0–0.7)
Eosinophil %: 0.2 %
HCT: 42.6 % (ref 40.0–52.0)
HGB: 13.4 g/dL (ref 13.0–18.0)
LYMPHS PCT: 10.6 %
Lymphocyte #: 0.9 10*3/uL — ABNORMAL LOW (ref 1.0–3.6)
MCH: 27.7 pg (ref 26.0–34.0)
MCHC: 31.6 g/dL — ABNORMAL LOW (ref 32.0–36.0)
MCV: 88 fL (ref 80–100)
MONO ABS: 0.6 x10 3/mm (ref 0.2–1.0)
MONOS PCT: 6.7 %
NEUTROS ABS: 7 10*3/uL — AB (ref 1.4–6.5)
Neutrophil %: 82.3 %
PLATELETS: 120 10*3/uL — AB (ref 150–440)
RBC: 4.85 10*6/uL (ref 4.40–5.90)
RDW: 17.8 % — ABNORMAL HIGH (ref 11.5–14.5)
WBC: 8.5 10*3/uL (ref 3.8–10.6)

## 2013-10-22 LAB — APTT: ACTIVATED PTT: 98.7 s — AB (ref 23.6–35.9)

## 2013-10-22 LAB — CREATININE, SERUM
Creatinine: 0.47 mg/dL — ABNORMAL LOW (ref 0.60–1.30)
EGFR (African American): >60
EGFR (Non-African Amer.): >60

## 2013-10-22 LAB — CULTURE, BLOOD (SINGLE)

## 2013-10-23 LAB — APTT: Activated PTT: 126.5 secs — ABNORMAL HIGH (ref 23.6–35.9)

## 2013-11-04 ENCOUNTER — Ambulatory Visit: Payer: Self-pay | Admitting: Internal Medicine

## 2013-11-06 ENCOUNTER — Inpatient Hospital Stay: Payer: Self-pay | Admitting: Student

## 2013-11-06 LAB — URINALYSIS, COMPLETE
Bilirubin,UR: NEGATIVE
GLUCOSE, UR: NEGATIVE mg/dL (ref 0–75)
Hyaline Cast: 33
KETONE: NEGATIVE
Nitrite: NEGATIVE
Ph: 6 (ref 4.5–8.0)
Protein: 25
RBC,UR: 13 /HPF (ref 0–5)
Specific Gravity: 1.02 (ref 1.003–1.030)
Squamous Epithelial: 12
WBC UR: 26 /HPF (ref 0–5)

## 2013-11-06 LAB — COMPREHENSIVE METABOLIC PANEL
ALK PHOS: 84 U/L
ALT: 479 U/L — AB (ref 12–78)
AST: 405 U/L — AB (ref 15–37)
Albumin: 2.9 g/dL — ABNORMAL LOW (ref 3.4–5.0)
Anion Gap: 4 — ABNORMAL LOW (ref 7–16)
BUN: 36 mg/dL — ABNORMAL HIGH (ref 7–18)
Bilirubin,Total: 0.6 mg/dL (ref 0.2–1.0)
CALCIUM: 9.5 mg/dL (ref 8.5–10.1)
CHLORIDE: 96 mmol/L — AB (ref 98–107)
Co2: 39 mmol/L — ABNORMAL HIGH (ref 21–32)
Creatinine: 0.66 mg/dL (ref 0.60–1.30)
EGFR (Non-African Amer.): >60
Glucose: 103 mg/dL — ABNORMAL HIGH (ref 65–99)
Osmolality: 286 (ref 275–301)
Potassium: 5.4 mmol/L — ABNORMAL HIGH (ref 3.5–5.1)
SODIUM: 139 mmol/L (ref 136–145)
TOTAL PROTEIN: 6.9 g/dL (ref 6.4–8.2)

## 2013-11-06 LAB — CBC
HCT: 50.7 % (ref 40.0–52.0)
HGB: 15.5 g/dL (ref 13.0–18.0)
MCH: 27.7 pg (ref 26.0–34.0)
MCHC: 30.7 g/dL — ABNORMAL LOW (ref 32.0–36.0)
MCV: 90 fL (ref 80–100)
Platelet: 128 10*3/uL — ABNORMAL LOW (ref 150–440)
RBC: 5.61 10*6/uL (ref 4.40–5.90)
RDW: 20.1 % — ABNORMAL HIGH (ref 11.5–14.5)
WBC: 12.2 10*3/uL — AB (ref 3.8–10.6)

## 2013-11-06 LAB — AMMONIA: Ammonia, Plasma: 43 mcmol/L — ABNORMAL HIGH (ref 11–32)

## 2013-11-06 LAB — TROPONIN I: Troponin-I: <0.02 ng/mL

## 2013-11-07 LAB — CBC WITH DIFFERENTIAL/PLATELET
BASOS PCT: 0.3 %
Basophil #: 0 10*3/uL (ref 0.0–0.1)
Eosinophil #: 0.3 10*3/uL (ref 0.0–0.7)
Eosinophil %: 3.5 %
HCT: 46.6 % (ref 40.0–52.0)
HGB: 13.9 g/dL (ref 13.0–18.0)
LYMPHS ABS: 1.5 10*3/uL (ref 1.0–3.6)
LYMPHS PCT: 17.1 %
MCH: 27.3 pg (ref 26.0–34.0)
MCHC: 29.8 g/dL — ABNORMAL LOW (ref 32.0–36.0)
MCV: 92 fL (ref 80–100)
Monocyte #: 0.6 x10 3/mm (ref 0.2–1.0)
Monocyte %: 6.7 %
Neutrophil #: 6.2 10*3/uL (ref 1.4–6.5)
Neutrophil %: 72.4 %
Platelet: 110 10*3/uL — ABNORMAL LOW (ref 150–440)
RBC: 5.07 10*6/uL (ref 4.40–5.90)
RDW: 19.4 % — ABNORMAL HIGH (ref 11.5–14.5)
WBC: 8.6 10*3/uL (ref 3.8–10.6)

## 2013-11-07 LAB — BASIC METABOLIC PANEL
Anion Gap: 2 — ABNORMAL LOW (ref 7–16)
BUN: 29 mg/dL — ABNORMAL HIGH (ref 7–18)
CHLORIDE: 99 mmol/L (ref 98–107)
Calcium, Total: 8.8 mg/dL (ref 8.5–10.1)
Co2: 44 mmol/L (ref 21–32)
Creatinine: 0.48 mg/dL — ABNORMAL LOW (ref 0.60–1.30)
EGFR (African American): >60
EGFR (Non-African Amer.): >60
Glucose: 100 mg/dL — ABNORMAL HIGH (ref 65–99)
OSMOLALITY: 295 (ref 275–301)
Potassium: 4.6 mmol/L (ref 3.5–5.1)
Sodium: 145 mmol/L (ref 136–145)

## 2013-11-07 LAB — TSH: Thyroid Stimulating Horm: 0.988 u[IU]/mL

## 2013-11-07 LAB — HEPATIC FUNCTION PANEL A (ARMC)
AST: 239 U/L — AB (ref 15–37)
Albumin: 2.7 g/dL — ABNORMAL LOW (ref 3.4–5.0)
Alkaline Phosphatase: 71 U/L
Bilirubin, Direct: 0.1 mg/dL (ref 0.00–0.20)
Bilirubin,Total: 0.5 mg/dL (ref 0.2–1.0)
SGPT (ALT): 364 U/L — ABNORMAL HIGH (ref 12–78)
TOTAL PROTEIN: 5.9 g/dL — AB (ref 6.4–8.2)

## 2013-11-07 LAB — URINE CULTURE

## 2013-11-08 LAB — BASIC METABOLIC PANEL
BUN: 17 mg/dL (ref 7–18)
CHLORIDE: 99 mmol/L (ref 98–107)
CO2: 42 mmol/L — AB (ref 21–32)
Calcium, Total: 8.7 mg/dL (ref 8.5–10.1)
Creatinine: 0.6 mg/dL (ref 0.60–1.30)
EGFR (African American): >60
EGFR (Non-African Amer.): >60
Glucose: 133 mg/dL — ABNORMAL HIGH (ref 65–99)
Osmolality: 283 (ref 275–301)
POTASSIUM: 3.7 mmol/L (ref 3.5–5.1)
Sodium: 140 mmol/L (ref 136–145)

## 2013-11-11 LAB — CULTURE, BLOOD (SINGLE)

## 2013-12-02 ENCOUNTER — Ambulatory Visit: Payer: Self-pay | Admitting: Internal Medicine

## 2013-12-02 DEATH — deceased

## 2015-01-24 NOTE — H&P (Signed)
PATIENT NAME:  Gordon [name], Gordon [name] MR#:  [id number] DATE OF BIRTH:  1965/10/09  DATE OF ADMISSION:  09/30/2013  ADDENDUM:   MEDICATIONS:   1.  Alprazolam 0.25 mg oral every eight hours as needed.  2.  Lasix 80 mg oral daily.  3.  Glipizide extended release 5 mg oral daily.  4.  Glucan 1 mg injectable as needed.   6.  Potassium 20 mEq oral daily.  7.  Levothyroxine 50 mcg oral daily.  8.  Multivitamin with mineral 1 tablet daily.  9.  Oxcarbazepine 10 mL 2 times a day 300 mg, they are each 5 mL.   10.  Sodium bicarbonate 325 mg oral 2 times a day.  11.  Systane Ultra ophthalmic solution, one drop every six hours as needed.  12.  Triamcinolone topical as needed.  13.  Vitamin D3 50,000 international units once a month.   ASSESSMENT AND PLAN: Diabetes mellitus. We will hold oral agents glipizide, we will have him on insulin sliding scale.    ____________________________ Starleen Armsawood S. Haruo Stepanek, MD dse:cc D: 09/30/2013 22:21:07 ET T: 10/01/2013 01:07:46 ET JOB#: [id number]  cc: Starleen Armsawood S. Lynkin Saini, MD, <Dictator> Caius Silbernagel Teena IraniS Stephaine Breshears MD ELECTRONICALLY SIGNED 10/01/2013 21:15

## 2015-01-24 NOTE — H&P (Signed)
PATIENT NAME:  Gordon [name], Gordon [name] MR#:  [id number] DATE OF BIRTH:  05-Jan-1966  DATE OF ADMISSION:  09/30/2013  REFERRING PHYSICIAN: Dr. Harvest Dark.   PRIMARY CARE PHYSICIAN: Dr. Nicky Pugh.   CHIEF COMPLAINT: Shortness of breath.   HISTORY OF PRESENT ILLNESS: This is a 49 year old male with significant past medical history of Prader-Willi syndrome, hypothyroidism, obstructive sleep apnea, obesity hypoventilation syndrome noncompliant with BiPAP, morbid obesity, hypothyroidism and history of CHF. Presents with complaints of shortness of breath. The patient reports he has been having his symptoms for a few weeks, but he was found today to be having O2 sats in the 80s which prompted them to bring him to the ED. The patient has been complaining of cough and runny nose and upper respiratory symptoms, but he denies any fever, any chills, any chest pain, chest discomfort, any productive sputum. The patient reported he has been having chronic lower extremity edema due to chronic lymphedema. The patient is known to have a history of obesity hypoventilation syndrome and obstructive sleep apnea, but he is noncompliant with BiPAP. As well, he was supposed to be on p.r.n. O2. The patient's chest x-ray did show evidence of congestion. Does not show any opacity or infiltrate. The patient's labs were significant for showing a BNP of 7000. As well, he had elevated LFTs where his AST was found to be 395 and ALT was found to be 583. The patient received 40 of IV Lasix, where he had significant improvement of his shortness of breath. As well, he was started on BiPAP as his pCO2 was found to be 106 on the ABG. Hospitalist service was requested to admit the patient for further workup for his acute on chronic respiratory failure.   PAST MEDICAL HISTORY:  1. Chronic respiratory failure.  2. CHF.   3. History of UTI.  4. Diabetes mellitus.  5. Hypothyroidism.  6. Morbid obesity.  7. Bipolar disorder.  8.  Lymphedema.  9. Vitamin D deficiency.  10. Prader-Willi syndrome.   ALLERGIES: No known drug allergies.   SOCIAL HISTORY: The patient currently lives in [facility]. No tobacco. No alcohol use.   FAMILY HISTORY: No known history of diabetes or thyroid disease in the family.   REVIEW OF SYSTEMS:  CONSTITUTIONAL: The patient is an extremely poor historian. Cannot obtain reliable review of systems from the patient. but  mother at bedside and she gives most of the history. There is no fever, no chills. There is progressive weakness and fatigue. Currently wheelchair dependent. There is weight loss, 100 pounds over the last few months.  EYES: Denies blurry vision, double vision, inflammation, glaucoma.  ENT: Denies tinnitus, ear pain, hearing loss. Has a runny nose but no epistaxis.  RESPIRATORY: Cough. No productive sputum. Shortness of breath.  CARDIOVASCULAR: No chest pain. Has chronic lower extremity edema. No palpitations.  GASTROINTESTINAL: Denies nausea, vomiting, diarrhea, abdominal pain.  GENITOURINARY: No dysuria, hematuria, renal colic.  ENDOCRINE: Denies polyuria, polydipsia, heat or cold intolerance.  HEMATOLOGY: Denies anemia, easy bruising, bleeding diathesis.  INTEGUMENTARY: Denies acne, rash or skin lesion. Has chronic lower extremity skin changes due to his lymphedema.  MUSCULOSKELETAL: Has chronic weakness. No arthritis. No gout. Has limited activity.  NEUROLOGIC: No CVA, TIA, seizures, headache.  PSYCHIATRIC: No anxiety, insomnia. Has history of depression.   PHYSICAL EXAMINATION:  VITAL SIGNS: Temperature 98.3, pulse 75, respiratory rate 22, blood pressure 106/76, saturating 94% on BiPAP.  GENERAL: Morbidly obese male, looks comfortable in bed, in no apparent distress  on BiPAP. Has the stigmata of Prader-Willi syndrome.  HEENT: Head atraumatic, normocephalic. Pupils equal, reactive to light. Almond-shaped eyes. Anicteric sclerae.  NECK: Supple. No  thyromegaly. Short. No bruits.  RESPIRATORY: The patient had fair air entry bilaterally. No wheezing, rales, rhonchi.  CARDIOVASCULAR: S1, S2 heard. No rubs or gallops.  ABDOMEN: Soft, nontender, nondistended. No organomegaly appreciated.  EXTREMITIES: Chronic lower extremity lymphedema with mild erythema, nontender. Pedal pulses felt bilaterally.  NEUROLOGIC: Motor and cranial nerves grossly intact, nonfocal.  MUSCULOSKELETAL: Has small hands and feet.  PSYCHIATRIC: Appropriate affect. Awake, alert. Intact judgment and insight. Extremely poor historian.   PERTINENT LABORATORIES: Glucose 143, BNP 7021, BUN 24, creatinine 0.71, sodium 138, potassium 4.4. ALT 583, AST 395, alk phos 82. White blood cells 12.1, hemoglobin 14.2, hematocrit 46.2, platelets 159. Urinalysis showing 3 white blood cells, trace leukocyte esterase. PH 7.32, pCO2 of 106, pO2 of 111.   IMAGING: Chest x-ray, single view, showing hypoventilation with bibasilar atelectasis. Mild vascular congestion also present.   ASSESSMENT AND PLAN:  1. Acute on chronic respiratory failure: The patient is known to have history of baseline chronic respiratory failure on p.r.n. oxygen saturation and nocturnal BiPAP, even though he is noncompliant. Appears to be worsened due to congestive heart failure and his baseline obesity hypoventilation syndrome. As is having elevated BNP and congestion on chest x-ray, the patient will be started on intravenous Lasix. Will have him on 40 intravenous every 12 hours or a total of 4 doses. Will monitor his electrolytes and replace as needed. Will continue his Aldactone as well. Will check 2-D echo. Will continue to cycle his cardiac enzymes. As well, will keep him on BiPAP p.r.n. and at bedtime.  2. Hypothyroidism: Will continue with Synthroid.  3. Elevated liver function tests: Unclear what is the etiology. Will check right upper quadrant ultrasound.  4. Chronic lymphedema.  5. Prader-Willi syndrome.  6.  Obstructive sleep apnea, obesity hypoventilation syndrome: Will continue the patient on BiPAP.  7. Morbid obesity: Discussed with mother about the importance of weight loss. Reports he lost 100 pounds over the last year since he has been in the [employer].  8. Deep vein thrombosis prophylaxis: Subcutaneous heparin.   CODE STATUS: Discussed with mother, and the patient is FULL CODE.   TOTAL TIME SPENT ON ADMISSION AND PATIENT CARE: 55 minutes.   ____________________________ Albertine Patricia, MD dse:gb D: 09/30/2013 22:18:15 ET T: 10/01/2013 00:10:25 ET JOB#: [id number]  cc: Albertine Patricia, MD, <Dictator> Nejla Reasor Graciela Husbands MD ELECTRONICALLY SIGNED 10/01/2013 21:14

## 2015-01-25 NOTE — Consult Note (Signed)
   Comments   I met with pt's mother and mother's niece. Updated them on pt's current medical condition. I discussed the options of continued hospitalizations vs comfort care in the facility. Mother is very understanding of the options but feels that pt has a wonderful quality of life when he is stable and does not feel that she is ready to let him go. Mother feels that God will take pt when he is ready. I offered support to pt's mother who has devoted her life to pt and clearly loves him very much. She thanked me at the end of the meeting for our discussion.   Electronic Signatures: Jacksen Isip, Izora Gala (MD)  (Signed 04-Feb-15 16:06)  Authored: Palliative Care   Last Updated: 04-Feb-15 16:06 by Junetta Hearn, Izora Gala (MD)

## 2015-01-25 NOTE — Discharge Summary (Signed)
PATIENT NAME:  Gordon [name], Gordon [name] MR#:  [id number] DATE OF BIRTH:  09-29-1966  DATE OF ADMISSION:  09/30/2013 DATE OF DISCHARGE:  10/07/2013  CHIEF COMPLAINT: Shortness of breath.   PRIMARY CARE PHYSICIAN: Dr. Maryellen Pile.   DISCHARGE DIAGNOSES: 1. Acute on chronic respiratory failure secondary to combination of obstructive sleep apnea acute on chronic diastolic congestive heart failure, obesity, hypoventilation syndrome.  2. Chronic respiratory failure.  3. Chronic hypercapnia.  4. Prader-Willi syndrome.  5. Morbid obesity.  6. Chronic lymphedema.  7. Hypothyroidism.  8. Elevated LFTs possibly secondary to alcoholic steatosis or hepatocellular disease.  9. Hand tremors.  10. History of urinary tract infections.  11. Diabetes.  12. Bipolar.  13. Vitamin D deficiency.   DISCHARGE MEDICATIONS: Klor-Con 20 mEq once a day, alprazolam 0.25 mg every eight hours as needed for anxiety, glipizide extended release 5 mg once a day glucagon HypoKit 1 mg intramuscularly as needed for hypoglycemia, Lactinex 1 million cell 1 tab 2 times a day for probiotics, levothyroxine 50 mcg daily, multivitamin with minerals once a day, oxcarbazepine 300 mg per 5 mL please take 10 mL 2 times a day, sodium bicarbonate 325 mg 1 tab 2 times a day, spironolactone 50 mg once a day, Systane ophthalmic solution one drop to each eye every six hours as needed, triamcinolone topical applied to bilateral lower extremities 2 times a day, vitamin D3 50,000 units once a week on Saturdays. Atenolol 25 mg daily, furosemide 40 mg 2 times a day. He will be  going home with oxygen 2 liters per nasal cannula continuous.   DIET: Low sodium, low fat, low cholesterol, ADA diet.   ACTIVITY: As tolerated.   FOLLOWUP: Please follow with PCP within 1 to 2 weeks. Please have BiPAP or CPAP at night for obstructive sleep apnea.   DISPOSITION: Back to her skilled nursing facility.   CODE STATUS: The patient is full code.   HISTORY OF PRESENT  ILLNESS AND HOSPITAL COURSE: For full details of H and P, please see the dictation on 12/28 by Dr. Randol Kern, but briefly this is a 49 year old, obese male with history of Prater Willi syndrome, hypothyroidism, obstructive sleep apnea, chronic respiratory failure, and obesity, hypoventilation syndrome, who came in for shortness of breath with sats in the 80s.  The symptoms have been going on for a few weeks. He came into the hospital. He is noncompliant with BiPAP. He gets also appears to be somewhat noncompliant with oxygen, taking it off at times. He is found to have elevated LFTs, as well as BNP of 7000 range. An ABG was done showing significant acidosis, and he was started on BiPAP here for active on chronic hypercapnic respiratory failure. He was admitted to the hospitalist service. Based on level of pH and pCO2 he appears to be a chronic CO2 retainer. He was started on Lasix and was also placed on high flow nasal cannula at times. An echocardiogram was done showing ejection fraction of 55% to 60% with decreased LV internal cavity size. He was slow to improve, but did improve. He was slowly transitioned onto 2 liters which he is on now. Of note, he is supposed to be on oxygen as well as an outpatient, but as a p.r.n. basis; however, I suspect that he needs oxygen chronically. I believe that his acute on chronic respiratory failure is secondary to his obesity, hypoventilation syndrome, obstructive sleep apnea and bout of acute on chronic diastolic congestive heart failure, as his ejection fraction was noted to be  normal. He has diuresed well. He is doing well at this point, without any shortness of breath. He was also noted to have elevated LFTs. Of note, his AST was 395 and ALT is 583 on 12/28.  The bilirubin and alkaline phosphatase was within normal limits. His albumin was 2.6. Subsequent LFTs were drawn on 12/30 showing improvement in both AST and ALT. The AST came back down to 177 and the ALT to 421. An  ultrasound of abdomen, limited survey was done on 12/29 showing cholelithiasis without evidence for acute cholecystitis. Also of note biliary ductal dilation was  shown, but the patient was noted to have diffuse hepatic steatosis versus  hepatocellular disease without focal hepatic parenchymal abnormalities. The transaminases have improved and they should be followed up as an outpatient. At this point, he is back to its baseline and he will be discharged as dictated above.   TOTAL TIME SPENT: 35 minutes.    ____________________________ Krystal EatonShayiq Kenleigh Toback, MD sa:sg D: 10/07/2013 11:46:23 ET T: 10/07/2013 12:09:18 ET JOB#: [id number]  cc: Krystal EatonShayiq Josselin Gaulin, MD, <Dictator>

## 2015-01-25 NOTE — Discharge Summary (Signed)
PATIENT NAME:  Gordon [name], Gordon [name] MR#:  [id number] DATE OF BIRTH:  October 20, 1965  DATE OF ADMISSION:  10/17/2013 DATE OF DISCHARGE:  10/23/2013    ADMITTING PHYSICIAN: Gladstone Lighter, MD  DISCHARGING PHYSICIAN: Gladstone Lighter, M.D.   PRIMARY CARE PHYSICIAN: Dr. Brynda Greathouse at [facility].   DISCHARGE DIAGNOSES:  1.  Acute respiratory failure.  2.  Chronic respiratory failure, on 2 liters home oxygen.  3.  Acute-on-chronic diastolic congestive heart failure exacerbation.  4.  Obesity, hypoventilation syndrome.  5.  Diabetes mellitus.  6.  Hypothyroidism.  7.  Morbid obesity.  8.  Chronic lower extremity lymphedema.  9.  Bipolar disorder.  10.  Prader-Willi syndrome.  11.  Vitamin B deficiency.  12.  Obstructive sleep apnea.   CONSULTATIONS IN THE HOSPITAL:  1.  Palliative care consultation by Dr. Izora Gala Phifer.  2.  Pulmonary critical care consultation by Dr. Mortimer Fries.   DISCHARGE HOME MEDICATIONS:  1.  Klor-Con 20 mEq p.o. daily.  2.  Glipizide 5 mg p.o. daily.  3.  Glucagon kit for hypoglycemia 1 mg intramuscular as needed for hypoglycemia.  4.  Levothyroxine 50 mcg daily in the morning.  5.  Multivitamin 1 tablet p.o. daily.  6.  Oxcarbazepine 300 mg/5 mL - 10 mL twice a day.  7.  Sodium bicarbonate 325 mg one tablet p.o. b.i.d.  8.  Spironolactone  50 mg p.o. daily.  9.  Sustenna Ultra ophthalmic solution 1 drop both eyes every 6 hours as needed for dry eyes.  10.  Triamcinolone topical cream 0.025% to bilateral lower extremities twice a day.  11.  Vitamin D3 at 50,000 international units 1 capsule once a month.  12.  Atenolol 25 mg p.o. daily.  13.  Lasix 40 mg p.o. b.i.d.  14.  Lactobacillus 1 tablet p.o. b.i.d.  15.  Prednisone taper over 6 days.  16.  Xanax 0.25 mg q.8 hours p.r.n. for anxiety.   DISCHARGE DIET: Carbohydrate controlled diet.    DISCHARGE OXYGEN: Three liters home oxygen.   DISCHARGE ACTIVITY: As tolerated.    FOLLOWUP INSTRUCTIONS: PCP  followup in 1 week.   LABORATORY AND IMAGING STUDIES PRIOR TO DISCHARGE:  1.  WBC 8.5, hemoglobin 13.4, hematocrit 42.6, platelet count 120.  2.  Sodium 139, potassium 4.2, chloride 88, bicarb greater than 45, BUN 24, creatinine 0.59, glucose 174, calcium 19.1.  3.  Lung V/Q scan showing very low probability for acute pulmonary embolus.  4.  Ultrasound Doppler bilateral lower extremities showing no evidence of any DVT.  5.  Urinalysis negative for any infection.   BRIEF HOSPITAL COURSE: Mr. Gordon [name] is a 48 year old Caucasian male with past medical history significant for Prader-Willi syndrome with morbid obesity who is bedbound at baseline. Also has a history of diastolic congestive heart failure, obesity, hypoventilation syndrome, obstructive sleep apnea, who is from [facility] but was brought in secondary to worsening respiratory status.   1.  Acute-on-chronic respiratory failure. The patient when he came in was significantly hypoxic, was placed on BiPAP. He could not tolerate it so was changed over to a Ventimask. He was requiring 51% FiO2 on the Ventimask. Initial suspicion was possibly acute-on-chronic diastolic CHF exacerbation. He was already taking Lasix at home but just changed over to IV. Has underlying obesity hypoventilation syndrome and sleep apnea. ABG just showed normal Ph with elevated CO2 which is chronic along with bicarb.   Because of his significant hypoxia in spite of the steroids and Lasix, he was started empirically  on IV heparin until PE was ruled out. A CT scan could not be done because of his body habitus. He could not fit in the CT scanner. A V/Q scan was ordered and Doppler of the lower extremities was done, which were both negative for any clot suspicion, so heparin was stopped. The patient's oxygen was weaned gradually and he is down to 3 liters at this time. His mental status has improved significantly since admission, and he is being safely discharged  home.   2.  Prader-Willi syndrome. He is at baseline, awake, alert, communicative and bedbound and does have chronic lymphedema. His mom takes care of him, and palliative care was consulted to address CODE STATUS as he was a "full code" before admission and he was changed over to DO NOT RESUSCITATE as per family wishes at this time.   3.  Diabetes mellitus. He is on glipizide which was continued at this time.   4.  Chronically elevated CO2. He is on sodium bicarb to compensate for that.   His course has been otherwise uneventful in the hospital.   DISCHARGE CONDITION: Stable.   DISCHARGE DISPOSITION: To [place] skilled nursing facility.   TIME SPENT ON DISCHARGE: 45 minutes.     ____________________________ Gladstone Lighter, MD rk:np D: 10/23/2013 14:19:49 ET T: 10/23/2013 15:01:36 ET JOB#: [id number]  cc: Gladstone Lighter, MD, <Dictator> Mikeal Hawthorne. Brynda Greathouse, MD Gladstone Lighter MD ELECTRONICALLY SIGNED 11/01/2013 13:44

## 2015-01-25 NOTE — H&P (Signed)
PATIENT NAME:  Gordon [name], Gordon [name] MR#:  [id number] DATE OF BIRTH:  27-May-1966  DATE OF ADMISSION:  11/06/2013  PRIMARY CARE PROVIDER: Dr. Brynda Greathouse.   CHIEF COMPLAINT: Decrease in responsiveness.   HISTORY OF PRESENT ILLNESS: The patient is a 49 year old white male with a diagnosis of Prader-Willi syndrome, who is bedbound at baseline from [facility]. The patient, unfortunately, has had multiple recent admissions, at least 3 within the past 2 months for respiratory failure, who was recently hospitalized and discharged on January 20th.  He was hospitalized from 01/14 to 01/20. At that time, he was admitted with acute respiratory failure due to diastolic CHF, possible pneumonia. The patient was discharged back to the facility. He is sent back due to lethargy and poor responsiveness. His mom is at the bedside. She reports that he was doing okay at discharge, but was not 100%. The patient was sent to the ED with decrease in responsiveness. His evaluation included a chest x-ray which was nonrevealing. His WBC count was mildly elevated. He also had abnormal LFTs, so I asked the ED physician to get a stat ABG. His ABG shows a pCO2 of 116. Currently, he is on 3 liters of oxygen and poorly responsive. His mother is at the bedside.   PAST MEDICAL HISTORY: Significant for:  1.  Chronic respiratory failure on chronic 2 liters of O2. 2.  History of diastolic CHF.  3.  Obesity hypoventilation syndrome.  4.  History of recurrent UTIs.  5.  Diabetes.  6.  Hypothyroidism.  7.  Morbid obesity.  8.  Lymphedema.  9.  Bipolar disorder.  10.  Prader-Willi syndrome.  11.  Obstructive sleep apnea, apparently he has not been on a CPAP machine.  12.  Vitamin D deficiency.   PAST SURGICAL HISTORY: None.   ALLERGIES: None.   CURRENT MEDICATIONS: He is on alprazolam 0.25, 1 tab p.o. q.8 p.r.n. for anxiety; atenolol 25 daily, Lasix 40, 1 tab p.o. b.i.d.; glipizide extended release 5 mg 1 tab p.o. daily, Glucagon  emergency kit for hypoglycemia, Klor-Con 20 mg 1 tab p.o. daily, lactobacillus 1 tab p.o. b.i.d., levothyroxine 50 mcg daily, multivitamin daily, NovoLog sliding scale, oxcarbazepine 300 mg/5 mL, 10 mL b.i.d.; sodium bicarb 325, 1 tab p.o. b.i.d.; spironolactone 50, 1 tab p.o. daily; Systane ULTRA ophthalmic 1 drop to each eye q.6 hours p.r.n., triamcinolone topically apply to affected area b.i.d., vitamin D3, 5000 International Units daily.   SOCIAL HISTORY: The patient has been a resident of [facility] for more than a year and a half. No smoking.   FAMILY HISTORY: Significant for multiple myeloma in the sister.   REVIEW OF SYSTEMS: Unobtainable due to the patient being very lethargic and poorly responsive.   PHYSICAL EXAMINATION: VITAL SIGNS: Temperature 97.9, pulse 53, respirations 30, blood pressure 110/61, O2 of 90% on 3 liters.  GENERAL: The patient is a morbidly obese male. Poorly responsive. Appears chronically ill as well as chronically ill.  HEENT: Normocephalic, atraumatic. Pupils equally round, reactive to light and accommodation. Anicteric sclerae. Unable to do extraocular movements. Oropharynx appears a little dry. Unable to examine the mouth.  NECK: Supple. No thyromegaly. No carotid bruits. No lymphadenopathy.  RESPIRATORY: Diminished breath sounds without any rales, rhonchi.  CARDIOVASCULAR: S1, S2 positive. No murmurs, rubs, clicks or gallops. PMI is not displaced.  ABDOMEN:  Obese,  soft, nontender, nondistended. No organomegaly appreciated.  EXTREMITIES: He has got chronic bilateral lower extremity edema, chronic skin changes. No erythema.  NEUROLOGICAL: Very lethargic. Will  open his eyes for a few seconds when stimulated.  SKIN: No acne. No rash.  PSYCHOLOGIC: The patient is very lethargic.   LABORATORY AND RADIOLOGICAL DATA: Evaluation so far, glucose 103, BUN 36, creatinine 0.66, sodium 139, potassium 5.4, chloride 96. CO2 is 39. LFTs: Total protein 6.9, albumin  2.9, bili total 0.6, alk phos 84, AST is 405. ALT is 479. Troponin less than 0.02. WBC 12.2, hemoglobin 15.5, platelet count 128. Chest x-ray limited, but no cardiopulmonary processes.   ASSESSMENT AND PLAN: The patient is a 49 year old morbidly obese male with Prader-Willi syndrome from [place] on chronic oxygenation, history of diastolic congestive heart failure, diabetes, lymphedema, who is brought in with poor responsiveness.  1.  Acute encephalopathy due to severe hypercarbic respiratory failure. The patient, at this point, will be placed on BiPAP. We will monitor his respiratory status. He is a DNR. We will monitor him in a CCU stepdown. Repeat ABG tomorrow.  2. Acute hypercarbic respiratory failure. It is a very good chance that the patient will need chronic BiPAP. I am not sure if the patient will be able to tolerate this in the long term. This is likely due to severe sleep apnea, as well as obesity hypoventilation syndrome.  3.  Elevated liver function tests, which are new compared to his recent hospitalization. We will check ultrasound of the abdomen. Check a hepatitis panel. Could be med induced or it could be used due to shock liver.  4.  Chronic respiratory failure. Continue BiPAP as above.  5.  Diabetes. We will place him on sliding scale, hold p.o. meds.  6.  Bipolar disorder. Hold home medications in light of his current mental status.  7.  Prader-Willi syndrome., Prognosis is very poor. Has been seen by palliative care during the last admission. Was made DNR.  If he does not improve, consider hospice.  8.  Hypothyroidism. We will continue Synthroid. Check a TSH.  9.  We will place him on heparin for deep vein thrombosis prophylaxis.    NOTE: 55 minutes of critical care time spent on this patient.      ____________________________ Lafonda Mosses. Posey Pronto, MD shp:dmm D: 11/06/2013 18:56:03 ET T: 11/06/2013 19:06:06 ET JOB#: [id number]  cc: Yaslene Lindamood H. Posey Pronto, MD,  <Dictator> Alric Seton MD ELECTRONICALLY SIGNED 11/18/2013 13:11

## 2015-01-25 NOTE — Discharge Summary (Signed)
PATIENT NAME:  Gordon [name], Gordon [name] MR#:  [id number] DATE OF BIRTH:  03-Oct-1966  DATE OF ADMISSION:  11/06/2013 DATE OF DISCHARGE:  11/09/2013   CONSULTANTS: Dr. Ermalinda Memos from palliative care.   PRIMARY CARE PHYSICIAN: Dr. Brynda Greathouse.   CHIEF COMPLAINT: Decrease in responsiveness.   DISCHARGE DIAGNOSES: 1.  Metabolic encephalopathy secondary to acute on chronic hypercapnic respiratory failure.  2.  Chronic diastolic congestive heart failure.  3.  Obstructive sleep apnea. 4.  Obesity hypoventilation syndrome.  5.  Hypertension.  6.  Morbid obesity.  7.  History of diabetes.  8.  Hypothyroidism.  9.  Chronic lymphedema.  10.  Bipolar disorder.  11.  Vitamin D deficiency.  12.  History of recurrent urinary tract infections.   DISCHARGE MEDICATIONS: Klor-Con 20 mEq oral once a day, glipizide extended-release 5 mg 1 tab once a day, levothyroxine 50 mcg daily, multivitamin 1 tab daily, oxcarbazepine 300 mg/5 mL please take 10 mL 2 times a day for seizures, sodium bicarbonate 325 mg 2 times a day, spironolactone 50 mg 1 tab once a day, triamcinolone 0.025% topical cream apply to cracked scaly areas 2 times a day for dry scaly skin, atenolol 25 mg daily, furosemide 40 mg 2 times a day, Glucagon emergency kit 1 mg injectable as needed for hypoglycemia, lactobacillus 1 tab 2 times a day, sliding scale insulin, Systane Ultra preservative-free ophthalmic solution 1 drop to each eye every 6 hours as needed. Vitamin D3 at 5000 units once a day, alprazolam 0.25 mg every 8 hours as needed for anxiety or nervousness. He will be getting discharged with 2 liters of oxygen via nasal cannula.   DIET: Low-sodium, low-fat, low-cholesterol ADA diet.   ACTIVITY: As tolerated.   FOLLOWUP INSTRUCTIONS: Please follow with PCP within 1 to 2 days. Please use CPAP at all nights, and  patient will likely need as-needed BiPAP, and this was discussed and communicated with Dr. Brynda Greathouse, the patient's primary care physician. Please  check liver function test as an outpatient in about a week.   CODE STATUS: The patient is DO NOT RESUSCITATE.  SIGNIFICANT LABORATORIES AND IMAGING: Initial BUN 36, creatinine 0.66, potassium 5.4, sodium 139. Troponin negative. AST 405, ALT 479. Last AST was 239 and ALT of 364. Initial ABG showed pH of 7.27 and pCO2 of 116, pO2 of 72. The last pH was 7.38, pCO2 of 83, pO2 of 48. Hepatitis panel A, B, and C negative. Initial white count of 12.2, hemoglobin 15.5. Blood cultures: No growth to date. Urine culture shows mixed flora. X-ray chest, one view, showing no active disease.   HISTORY OF PRESENT ILLNESS AND HOSPITAL COURSE: For full details of H and P, please take a look at the dictation on February 3 by Dr. Posey Pronto but briefly, this is a 49 year old morbidly obese male with Prader-Willi syndrome, chronic respiratory failure secondary to COPD, obstructive sleep apnea, chronic diastolic CHF, and obesity hypoventilation syndrome with multiple hospitalizations for respiratory failure recently, who came in again after he was found to be poorly arousable, lethargic. He was noted on the ABG to be retaining pCO2 again and started on BiPAP, admitted to the CCU. Soon after that, he started to regain mentation and on the BiPAP, pCO2 sequentially improved and he was weaned off the BiPAP. He is back to normal, and I suspect his acute encephalopathy is metabolic secondary to hypercapnic respiratory failure. Although he has a mildly elevated ammonia, I do not suspect he has hepatic encephalopathy, although he does have elevated transaminases which  are chronic.   His acute on chronic hypercapnic respiratory failure is likely secondary to OSA and obesity hypoventilation syndrome, and he does need CPAP at night for sure, and he will also likely need  BiPAP p.r.n., and this was communicated to Dr. Brynda Greathouse. Although he does not need the BiPAP at this point, he would be benefited from using p.r.n. BiPAP to drive down his pCO2.  Furthermore, the CPAP could be adjusted further, and this was all communicated with Dr. Brynda Greathouse.   In regards to his chronic diastolic CHF, he is at baseline. He did have mild hyperkalemia, and that has resolved. His transaminases are chronically elevated in the last several months, and this should be further investigated as an outpatient. His last potassium was 3.7 from yesterday and at this point, he is ready to be discharged back to his facility. The family was counseled about the use of CPAP and BiPAP as an outpatient. The patient is DO NOT RESUSCITATE.   PHYSICAL EXAMINATION: VITAL SIGNS: On the day of discharge, his temperature is 97.8. Pulse rate is 63, respiratory rate 24, blood pressure 113/73, O2 sat 93% on oxygen.  GENERAL: The patient is a morbidly obese male lying in bed, no obvious distress.  HEENT: Normocephalic, atraumatic. Moist mucous membranes.  CARDIOVASCULAR: S1, S2, regular. No significant murmurs appreciated.  LUNGS: Diminished breath sounds in all lung fields, but there is no wheezing or crackles.  ABDOMEN: Soft, nontender, morbidly obese.  EXTREMITIES: Chronic lymphedema without overt lower extremity edema.   He will be discharged back to his facility.   TOTAL TIME SPENT: 40 minutes.   ____________________________ Vivien Presto, MD sa:jcm D: 11/09/2013 14:00:09 ET T: 11/09/2013 14:27:00 ET JOB#: [id number]  cc: Vivien Presto, MD, <Dictator> Mikeal Hawthorne. Brynda Greathouse, MD Vivien Presto MD ELECTRONICALLY SIGNED 11/17/2013 11:01

## 2015-01-25 NOTE — H&P (Signed)
PATIENT NAME:  Gordon [name], Gordon [name] MR#:  [id number] DATE OF BIRTH:  [birth year]  DATE OF ADMISSION:  10/17/2013  ADMITTING PHYSICIAN: Gladstone Lighter, MD   PRIMARY CARE PHYSICIAN: Dr. Brynda Greathouse.   CHIEF COMPLAINT: Difficulty breathing.   HISTORY OF PRESENT ILLNESS: Gordon [name] is a 49 year old morbidly obese male with Prader-Willie syndrome who is bedbound at baseline from [facility] care skilled nursing facility, history of obstructive sleep apnea but noncompliant with the CPAP, hypothyroidism, diastolic congestive heart failure, and obesity, hypoventilation syndrome who presents to the hospital secondary to difficulty breathing.   The patient was very lethargic at the time of my exam and most of the history is obtained from his mother at bedside. According to the mother, the patient spoke with her last night over the phone and he sounded fine. He did not have any complaints but she got another call from him around 2:30 this morning, and the patient was telling her that he could not breathe, so she advised him to call the nursing home staff and let them know, and the patient was brought over here.   The patient is chronically on 2 liters oxygen at the nursing home, but he was hypoxic with sats  in the 80s when he initially presented, and he was placed on a 55% Ventimask.   The patient at this time is sleepy. He is easily arousable and denies any fevers, chills, cold, cough at this time.   His BNP seems to be elevated at 14,000 at this time, but he clinically does not appear fluid-overloaded.   PAST MEDICAL HISTORY:  1.  Chronic respiratory failure, on 2 liters home oxygen.  2.  Diastolic congestive heart failure, EF of 55%.  3.  Obesity, hypoventilation syndrome.  4.  History of UTIs.  5.  Diabetes mellitus.  6.  Hypothyroidism.  7.  Morbid obesity.  8.  Lymphedema.  9.  Bipolar disorder.  10.  Prader-Willie syndrome.  11.  Obstructive sleep apnea.  12.  Vitamin D deficiency.   PAST  SURGICAL HISTORY: None.    ALLERGIES: No known drug allergies.   HOME MEDICATIONS:  Include:  1.  Xanax 0.25 mg p.o. q.8 hours p.r.n. for anxiety.  2.  Atenolol 25 mg p.o. daily.  3.  Furosemide 20 mg p.o. b.i.d.  4.  Glipizide 5 mg p.o. daily.  5.  Glucagon kit for hyperglycemia, 1 mg injectable as needed.  6.  Klor-Con 20 mEq p.o. daily.  7.  Levothyroxine 50 mcg p.o. in the morning.  8.  Multivitamin 1 tablet p.o. daily.  9.  Oxcarbazepine 300 mg per 5 mL suspension - 10 mL p.o. b.i.d.  10.  Sodium bicarbonate 325 mg one tablet p.o. b.i.d.  11.  Spironolactone 50 mg p.o. daily.  12.  Systane ophthalmic solution both eyes every 6 hours as needed.  13.  Triamcinolone topical cream 0.025% bilateral lower extremities to cracked skin. 14.  Vitamin D3 at 50,000 international units once a month.   SOCIAL HISTORY: The patient has been a resident at [facility] for more than a year and a half. No smoking. Has alcohol use.   FAMILY HISTORY: Significant for multiple myeloma in sister.   REVIEW OF SYSTEMS: Difficult to be obtained as the patient is weak.   PHYSICAL EXAMINATION:  VITAL SIGNS: Temperature 97.5 degrees Fahrenheit, pulse 68, respirations 22, blood pressure 132/76, pulse oximetry was 92% on 55% Ventimask.  GENERAL: Heavily built, well-nourished male lying in bed. He is comfortable, not  in any acute distress.   HEENT: Normocephalic, atraumatic. Pupils are equal, round, reacting to light. Anicteric sclerae. Extraocular movements intact. Oropharynx clear without erythema, mass or exudates. Very poor oral hygiene.   NECK: Supple. No thyromegaly. Short and thick. No bruits heard. No lymphadenopathy.  RESPIRATORY: Moving air bilaterally. Decreased bibasilar breath sounds. No wheeze or crackles significantly.  CARDIOVASCULAR: S1, S2 regular rate and rhythm. No murmurs, rubs or gallops.  ABDOMEN: Obese, soft, nontender, nondistended. No organomegaly appreciated.  EXTREMITIES:  Chronic bilateral lower extremity edema and chronic skin changes but no active signs of any cellulitis seen. Nontender. Dorsalis pedis pulses are extremely feeble to palpate bilaterally.  NEUROLOGIC: Sleepy, easily arousable, able to move all 4 extremities. No neurological changes noted.  SKIN: No acne, rash or lesions.   PSYCHOLOGIC: Awake, sleepy, easily arousable, and oriented to self at this time.   LABORATORY DATA: WBC is 10.2. Hemoglobin 14.3, hematocrit 45.6, platelet count 171.   Sodium 136, potassium 4.4, chloride 89, bicarb greater than 45, BUN 27, creatinine 0.81, glucose 142 and calcium of 9.0.   ALT 27, AST 32, alk phos 85, total bili 0.2, and albumin of 3.2. INR 1.1. Troponin 0.04. BNP is elevated at 14, 713. Blood cultures are negative.   EKG is showing left axis deviation, normal sinus rhythm. No acute changes seen. Complete right  bundle branch block noted.   Chest x-ray showing hypoaeration and pulmonary venous congestion.   ASSESSMENT AND PLAN: A 49 year old male with obesity, Prader-Willi syndrome, chronically on 2 liters oxygen, diastolic congestive heart failure, diabetes mellitus, and emphysema, who presents for dyspnea and hypoxia.  1.  Acute-on-chronic respiratory failure. He was hypoxic on 2 liters oxygen, was placed on BiPAP initially and then changed over to Ventimask again at this time. Chest x-ray shows some congestion but not very impressive for any congestive heart failure exacerbation. Underlying pulmonary embolism cannot be ruled out especially since he is bedbound and obese; however, the patient cannot fit in a CT scanner so ordered a V/Q scan, but since he was on BiPAP at the  time and could not lie flat for the V/Q scan for a long time, that has been canceled at this time. Because of his significant hypoxia, he is empirically started on IV heparin. It could also be from worsening of his obesity, hypoventilation and obstructive sleep apnea. Will add Solu-Medrol IV  for scattered wheezing. He has an ulcer. Will get palliative care consultation. We will hold off on empiric antibiotics as no fevers or elevated WBC at this time.  2.  Diabetes mellitus. Continue glipizide. Will add sliding scale insulin while he is here.  3.  Bipolar with behavioral changes. Home medications are being continued.  4.  Prader-Willie syndrome. He is from the nursing home, bedbound at baseline. Usually alert and oriented per his mother but more sleepy now. Continue to monitor.  5.  Hypothyroidism on Synthroid.  6.  Deep vein thrombosis prophylaxis. The patient is on heparin drip at this time.   CODE STATUS: FULL CODE AT THIS TIME.  TIME SPENT ON ADMISSION: 50 minutes.   ____________________________ Gladstone Lighter, MD rk:np D: 10/17/2013 16:05:40 ET T: 10/17/2013 18:25:43 ET JOB#: [id number]  cc: Gladstone Lighter, MD, <Dictator> Gladstone Lighter MD ELECTRONICALLY SIGNED 10/18/2013 14:15

## 2015-01-25 NOTE — Discharge Summary (Signed)
PATIENT NAME:  Gordon [name], BEHNKEN MR#:  [id number] DATE OF BIRTH:  04/06/1966  DATE OF ADMISSION:  09/30/2013 DATE OF DISCHARGE:  10/07/2013  CHIEF COMPLAINT: Shortness of breath.   PRIMARY CARE PHYSICIAN: Dr. Maryellen Pile.   DISCHARGE DIAGNOSES: 1. Acute on chronic respiratory failure secondary to combination of obstructive sleep apnea acute on chronic diastolic congestive heart failure, obesity, hypoventilation syndrome.  2. Chronic respiratory failure.  3. Chronic hypercapnia.  4. Prader-Willi syndrome.  5. Morbid obesity.  6. Chronic lymphedema.  7. Hypothyroidism.  8. Elevated LFTs possibly secondary to alcoholic steatosis or hepatocellular disease.  9. Hand tremors.  10. History of urinary tract infections.  11. Diabetes.  12. Bipolar.  13. Vitamin D deficiency.   DISCHARGE MEDICATIONS: Klor-Con 20 mEq once a day, alprazolam 0.25 mg every eight hours as needed for anxiety, glipizide extended release 5 mg once a day glucagon HypoKit 1 mg intramuscularly as needed for hypoglycemia, Lactinex 1 million cell 1 tab 2 times a day for probiotics, levothyroxine 50 mcg daily, multivitamin with minerals once a day, oxcarbazepine 300 mg per 5 mL please take 10 mL 2 times a day, sodium bicarbonate 325 mg 1 tab 2 times a day, spironolactone 50 mg once a day, Systane ophthalmic solution one drop to each eye every six hours as needed, triamcinolone topical applied to bilateral lower extremities 2 times a day, vitamin D3 50,000 units once a week on Saturdays. Atenolol 25 mg daily, furosemide 40 mg 2 times a day. He will be  going home with oxygen 2 liters per nasal cannula continuous.   DIET: Low sodium, low fat, low cholesterol, ADA diet.   ACTIVITY: As tolerated.   FOLLOWUP: Please follow with PCP within 1 to 2 weeks. Please have BiPAP or CPAP at night for obstructive sleep apnea.   DISPOSITION: Back to her skilled nursing facility.   CODE STATUS: The patient is full code.   HISTORY OF PRESENT  ILLNESS AND HOSPITAL COURSE: For full details of H and P, please see the dictation on 12/28 by Dr. Randol Kern, but briefly this is a 49 year old, obese male with history of Prater Willi syndrome, hypothyroidism, obstructive sleep apnea, chronic respiratory failure, and obesity, hypoventilation syndrome, who came in for shortness of breath with sats in the 80s.  The symptoms have been going on for a few weeks. He came into the hospital. He is noncompliant with BiPAP. He gets also appears to be somewhat noncompliant with oxygen, taking it off at times. He is found to have elevated LFTs, as well as BNP of 7000 range. An ABG was done showing significant acidosis, and he was started on BiPAP here for active on chronic hypercapnic respiratory failure. He was admitted to the hospitalist service. Based on level of pH and pCO2 he appears to be a chronic CO2 retainer. He was started on Lasix and was also placed on high flow nasal cannula at times. An echocardiogram was done showing ejection fraction of 55% to 60% with decreased LV internal cavity size. He was slow to improve, but did improve. He was slowly transitioned onto 2 liters which he is on now. Of note, he is supposed to be on oxygen as well as an outpatient, but as a p.r.n. basis; however, I suspect that he needs oxygen chronically. I believe that his acute on chronic respiratory failure is secondary to his obesity, hypoventilation syndrome, obstructive sleep apnea and bout of acute on chronic diastolic congestive heart failure, as his ejection fraction was noted to be  normal. He has diuresed well. He is doing well at this point, without any shortness of breath. He was also noted to have elevated LFTs. Of note, his AST was 395 and ALT is 583 on 12/28.  The bilirubin and alkaline phosphatase was within normal limits. His albumin was 2.6. Subsequent LFTs were drawn on 12/30 showing improvement in both AST and ALT. The AST came back down to 177 and the ALT to 421. An  ultrasound of abdomen, limited survey was done on 12/29 showing cholelithiasis without evidence for acute cholecystitis. Also of note biliary ductal dilation was  shown, but the patient was noted to have diffuse hepatic steatosis versus  hepatocellular disease without focal hepatic parenchymal abnormalities. The transaminases have improved and they should be followed up as an outpatient. At this point, he is back to its baseline and he will be discharged as dictated above.   TOTAL TIME SPENT: 35 minutes.    ____________________________ Krystal EatonShayiq Emagene Merfeld, MD sa:sg D: 10/07/2013 11:46:00 ET T: 10/07/2013 12:09:18 ET JOB#: [id number]  cc: Krystal EatonShayiq Joanny Dupree, MD, <Dictator> Krystal EatonSHAYIQ Darleene Cumpian MD ELECTRONICALLY SIGNED 10/16/2013 11:29

## 2015-01-26 NOTE — Consult Note (Signed)
PATIENT NAME:  Gordon [name], [name] MR#:  [id number] DATE OF BIRTH:  09-10-1966  DATE OF CONSULTATION:  02/21/2012  REFERRING PHYSICIAN:  Dr. Mliss Fritzawood Elgergawy  CONSULTING PHYSICIAN:  A. Wendall MolaMelissa Jobany Montellano, MD  CHIEF COMPLAINT: Elevated TSH.   HISTORY OF PRESENT ILLNESS: This is a 49 year old male with Prader-Willi syndrome and morbid obesity as well as severe bilateral lower extremity edema who was admitted early today with shortness of breath attributed to bilateral pneumonia and possible congestive heart failure. Admission labs were significant for an elevated TSH of 63.4 with a low free thyroxine level of 0.50 in addition to an elevated glucose of 162 and a hemoglobin A1c of 7.3%. Patient has no known history of thyroid disease and to my knowledge, has not previously taken thyroid hormone replacement. He is somewhat of a poor historian. He only answers some questions asked of him. No recent outside records are available for my review. He is a patient of Dr. Alphonsus SiasLetvak. He also denies history of prior diabetes.    PAST MEDICAL HISTORY:  1. Prader-Willi syndrome.  2. Morbid obesity.  3. Bilateral lower extremity lymphedema.  4. Vitamin D deficiency.  5.   Hypogonadism  INPATIENT MEDICATIONS:  1. Levothyroxine 75 mcg per day (given today at 6:00 a.m.).  2. Zithromax 250 mg daily.  3. Rocephin 1 gram daily.  4. Fluoxetine 10 mg daily.  5. Lactobacillus 1 capsule b.i.d.  6. Potassium chloride 20 mEq daily.  7. Heparin 5000 units sub-Q q.8 hours.   8. Furosemide 40 mg IV q.12 hours.  9. Ergocalciferol 50,000 units once monthly.    ALLERGIES: No known drug allergies.   SOCIAL HISTORY: Patient lives at home with his family. No tobacco or alcohol use.   FAMILY HISTORY: Per records, no known history of diabetes or thyroid disease.   REVIEW OF SYSTEMS: Unable to obtain as patient did not answer many of my questions.  PHYSICAL EXAMINATION: VITAL SIGNS: Height 60 inches, weight 425 pounds, BMI 80.  Temperature 99.4, pulse 85, respirations 22, blood pressure 154/89, pulse oximetry 90% on 6 liters O2.   GENERAL: Morbidly obese white male in no distress. Lacks signs of virilization such as facial hair.  HEENT: Strabismus is present. Extraocular movements are intact. Oropharynx clear.   NECK: No appreciable thyromegaly. Neck exam was difficult due to habitus.   LYMPH: No submandibular anterior cervical lymphadenopathy.   CARDIAC: Regular rate and rhythm with normal S1, S2. No murmur. No carotid bruit.   PULMONARY: Clear bilaterally without wheeze or rhonchi.   ABDOMEN: Diffusely soft, nontender.   EXTREMITIES: Severe lymphedema with chronic thickened skin of both lower extremities extending to the groin. There is some weeping of the skin.   PSYCHIATRIC: Patient is disheveled. He is calm. He repeatedly states "I do not have diabetes". He has poor insight. He is alert.   MUSCULOSKELETAL: Normal upper extremity tone. Unable to assess lower extremity tone.   LABORATORY, DIAGNOSTIC AND RADIOLOGICAL DATA: Glucose 162, hemoglobin A1c 7.3%. BNP 5250, BUN 13, creatinine 0.99, potassium 4.1, calcium 8.5, TSH 63, FT4 0.50, hematocrit 42, platelets 188, WBC 8.8.   ASSESSMENT: 49 year old male with morbid obesity, Prader-Willi syndrome, chronic lower extremity edema presenting with shortness of breath attributable to pneumonia and possible congestive heart failure, found to incidentally have uncontrolled hypothyroidism and diabetes, new onset.  1. Hypothyroidism. Likely has acquired primary hypothyroidism and warrants thyroid hormone replacement. Given possibility of uncontrolled congestive heart failure, his thyroid hormone dose should be started low and titrated up gradually  over weeks-months. A loading dose of 75 mcg was given this morning, which is certainly reasonable. I plan to adjust the dose to 25 mcg per day. He should have a TSH repeated in 2-4 weeks and the dose can be adjusted further.   2. Diabetes. Explain to patient that his elevated A1c is consistent with diabetes. Again given the possibility of congestive heart failure, will avoid use of metformin initially although this will be a good medication down the road. Will give glipizide 5 mg now as well as place him on a NovoLog sliding scale. It would be helpful to educate family and patient further about diabetes, diagnosis and complications as he was very resistant to accepting this diagnosis. I did not place a referral to diabetes educators today, however, this should be done after discussing it further with his family.   Thank you for the kind request for consultation. I will follow along with you.   ____________________________ A. Wendall Mola, MD ams:cms D: 02/21/2012 17:27:01 ET T: 02/22/2012 08:35:59 ET JOB#: [id number]  cc: A. Wendall Mola, MD, <Dictator> Macy Mis MD ELECTRONICALLY SIGNED 02/25/2012 16:51

## 2015-01-26 NOTE — Consult Note (Signed)
Psychological ConsultReviewed; Mother & brother interviewed and present during the interview with Gordon [name]  Reason for Consult:" PT recommends rehab but Gordon [name] refuses, please evaluate and assess competency to make decisions"Physician: Katharina Caperima Vaickute, MDPsychiatrist:  Sara ChuW. James Ryan, MD  Problem/Current Stressors: Gordon [name] is admitted to the hospital for palpitations, chills and shortness of breath.  He has been diagnosed with Prader-Willi syndrome. He is morbidly obese.  According to his family he had not been out of his home in three years.  He is not ambulatory.  Previously he was able to move to the bedside commode but is having more difficulty with this transfer, often wetting his pants. Status: Gordon [name] is alert and oriented to person, place and time. He was unable to say why he was in the hospital. Family states he is aware of his reasons for hospitalization and has a very good memory. He was pleasant and superficially cooperative. He made occasional eye contact but frequently looked to his mother or brother to answer questions for him. His speech is generally clear but he did mumble at times.  His mood is good with mildly blunted affect. Thought processes are logical and goal directed with no looseness of associations. However, he does not answer questions fully often waiting for his mother to speak for him. No suicidal or homicidal ideation noted. No auditory or visual hallucinations noted. Insight and judgment are poor especially in denying his Prader-Willi Syndrome.  Memory not formally tested. He completed the 10th grade in special education classes. His mother took him out of school in the 11th grade because the teacher didn?t want him in her class. Family members state that there is a hearing this Thursday, as mother is pursuing guardianship. Assessment: An evaluation was completed to assess Gordon [name] capacity to determine whether to return home or to a rehabilitation facility at the  time of discharge. It was explanied to Gordon [name] that the reason for the interview was to make a decision on his ability to make a choice about discharge. He appeared to understand this only partially. Gordon [name] is unable to say why he is hospitalized. He agrees to the reasons stated by family members including difficulty breathing.  He denies he has been diagnosed with Prader-Willi syndrome and according to family has done so for a very long time. He was in a rehab placement when he was 49 years old for 56 days. He was unhappy there. He states it was because of the food and agrees when asked if he did not like being on a diet. He also agrees to all the pleasant activities his mother describes as likening when he was in rehabilitation. He also indicates he likes things at home and the way he is at home and does not feel being able to move with less difficulty would improve his life. He is unable or unwilling to reason about his decision making process. He says he likes it at home and does not answer further questions on this topic. He states clearly he does not want to go to a rehabilitation facility but that he wants to go home.  Today, Gordon [name] does not possess the capacity to make the decision whether to be discharged to a rehabilitations facility or return home.   Electronic Signatures: Carola Frostoush, Lee Ann (PsyD, HSP-P)  (Signed on 20-May-13 17:17)  Authored  Last Updated: 20-May-13 17:17 by Carola Frostoush, Lee Ann (PsyD, HSP-P)

## 2015-01-26 NOTE — H&P (Signed)
PATIENT NAME:  Gordon [name], Gordon [name] MR#:  [id number] DATE OF BIRTH:  1966/07/02  DATE OF ADMISSION:  02/21/2012  PRIMARY CARE PHYSICIAN:  Dr. Alphonsus SiasLetvak REFERRING PHYSICIAN: Dr. Dolores FrameSung    CHIEF COMPLAINT: Shortness of breath, palpitations, chills.  HISTORY OF PRESENT ILLNESS:  Gordon [name] is a 49 year old male with significant past medical history of Prader-Willi syndrome who is morbidly obese and has been home for the last three years. He does not go out.  He has home visits by Dr. Alphonsus SiasLetvak. He presents with complaints of palpitations, chills, and shortness of breath.  History was obtained from both the patient and his mother at the bedside.  He has been at home for the last three years, has not left the home ever in the last three years. He is minimally ambulatory with the help of a walker. He spends most of his day in a recliner and sleeps in the recliner as well. The patient has been complaining of palpitations, heart racing, chest discomfort, and shortness of breath. Upon presentation to the ED, the patient was found to be hypoxic at 65%. The patient had an ABG done which did show pH of 7.36, pCO2 84, pO2 of 75. The patient was started on BiPAP where his shortness of breath much improved. The patient did not have any evidence of tachycardia or atrial fibrillation or any arrhythmias on the EKG. The patient's chest x-ray did show evidence of bilateral infiltrates as well as vascular congestion/congestive heart failure The patient was found to have oral temperature of 99.8.  The patient denies any productive cough but he reports he has been having a dry cough for a couple of months, reported to be due to allergies and he was prescribed allergy medication and his cough improved after that. The patient had blood cultures sent and was started on IV Rocephin and Zithromax in the ED and given IV Lasix for diagnosis of pneumonia. The hospitalist service was requested to admit for further management of his respiratory failure  and pneumonia. The patient had elevated TSH level of 63.4, and he had free thyroxine of 0.5. The patient denies any history of thyroid problems .  The patient's pro BNP was found to be 5250. There is no previous diagnosis of congestive heart failure but he was on Lasix at home.   PAST MEDICAL HISTORY:  Prader-Willi syndrome.   DRUG ALLERGIES:  No known drug allergies.    HOME MEDICATIONS:  1. Fluoxetine 10 mg oral daily.  2. Lasix 40 mg daily.  3. Potassium 20 mEq oral daily.  4. Probiotic1 tablet daily.  5. Vitamin D 5,000 international units monthly, oral.   SOCIAL HISTORY: The patient lives at home with his mom. Has not been outside his home for the last three years. No history of smoking, alcohol, or drug use.   FAMILY HISTORY: No history of diabetes or coronary artery disease.   REVIEW OF SYSTEMS: The patient denies any fever, but has complaints of chills and fatigue. EYES: Denies any blurry vision, double vision, or pain. ENT: Denies any tinnitus, ear pain, or hearing loss. RESPIRATORY: Complains of dry cough. Denies any wheezing or hemoptysis. Has mild shortness of breath. CARDIOVASCULAR: Denies any chest pain or orthopnea. Has chronic lymphedema, has complaints of palpitations. GASTROINTESTINAL: Denies any nausea, vomiting, diarrhea, or abdominal pain. GENITOURINARY: Denies any dysuria, hematuria, or renal colic. ENDO: Denies any polyuria, polydipsia, or heat or cold intolerance.  HEMATOLOGIC: Denies any anemia, easy bruising, or bleeding diathesis. Has chronic lymphedema. MUSCULOSKELETAL:  Denies any back, shoulder, knee, or hip pain. NEURO: Denies any numbness, dysarthria, or epilepsy. PSYCH: Denies any anxiety, insomnia, or heat or cold intolerance.     PHYSICAL EXAMINATION:  VITAL SIGNS: Temperature 99.7, pulse 89, respiratory rate 24, blood pressure 148/78, saturation 60% on room air on admission, now on BiPAP sating 95%.   GENERAL: He morbidly obese male on BiPAP, is comfortable  in bed in no apparent distress with stigmata of Prader-Willi syndrome.   HEENT: Head atraumatic, normocephalic. Pupils equal, reactive to light, almond-shaped eyes.  Anicteric sclerae. He is on BiPAP.   NECK: Short. No bruits.   RESPIRATORY: Good air entry bilaterally. No wheezing, rales, or rhonchi.   CARDIOVASCULAR: S1, S2 heard. No rubs, murmurs, or gallops.   ABDOMEN: Obese, soft, nontender.   EXTREMITIES:  Chronic lower extremity lymphedema, massive with mild erythema, nontender. The patient repeorts  his baseline with dressings bilaterally.   NEURO: Motor, cranial nerves grossly intact. Nonfocal.   MUSCULOSKELETAL: Has small hands and  feet.   PSYCHIATRIC: Appropriate affect. Awake, alert, intact judgment and insight.   PERTINENT LABS: Glucose 162. BNP 5250, BUN 13, creatinine 0.99, sodium 141, potassium 4.1, chloride 96, CO2 38, total protein 7.6, alkaline phosphatase 62, AST 45, ALT 37, troponin 0.03. TSH 63.4, free thyroxine 0.5, white blood cell 8.8, hemoglobin 13.1, hematocrit 42.2, platelets 188, pH 7.36, pCO2 84, pO2 75. Chest x-ray shows bilateral infiltrates with pulmonary edema.   ASSESSMENT AND PLAN:  1. Acute hypoxic hypercapnic respiratory failure: This appears to be multifactorial, mainly due to episodes of pneumonia as well as due to what appears to be congestive heart failure as the patient has elevated BNP and possibly hypoventilatory respiratory failure secondary to decreased muscle tone from Prader-Willi syndrome as well obesity  hypoventilatory syndrome. This somehow appears to be acute on chronic as the patient's CO2 is 38 and pH is 7.36, which is partially compensated to his elevated pCO2.  We will continue the patient on BiPAP and we will consult pulmonary service.  2. Pneumonia: We will have the patient on IV Rocephin and Zithromax. Blood cultures were sent.  3. Congestive heart failure:  Usually Prader-Willi syndrome is significant for right heart failure. We  will obtain 2-D echo. We will continue with diuresis with 40 of IV Lasix.   4. Lymphedema: We will consult wound care service for management. 5. Elevated TSH: Appears that the patient has severe hypothyroidism. We will start him on Synthroid 75 mcg p.o. daily and will consult endocrine.   6. Deep vein thrombosis prophylaxis: Subcutaneous heparin.  7. GI prophylaxis: P.o. Protonix.   CODE STATUS: The patient is FULL CODE.     TOTAL TIME SPENT ON PATIENT CARE: 50 minutes.     ____________________________ Starleen Arms, MD dse:bjt D: 02/21/2012 03:19:21 ET T: 02/21/2012 07:55:05 ET JOB#: [id number]  cc: Starleen Arms, MD, <Dictator> Karie Schwalbe, MD DAWOOD Teena Irani MD ELECTRONICALLY SIGNED 02/22/2012 0:09

## 2015-01-26 NOTE — Discharge Summary (Signed)
PATIENT NAME:  Gordon [name], Gordon [name] MR#:  [id number] DATE OF BIRTH:  1965-10-13  DATE OF ADMISSION:  02/21/2012 DATE OF DISCHARGE:  02/24/2012  ADMITTING DIAGNOSIS: Acute hypoxic hypercapnic respiratory failure.   DISCHARGE DIAGNOSES:  1. Acute hypoxic hypercapnic respiratory failure.  2. Congestive heart failure, left as well as right heart, suspected obstructive sleep apnea as well as obesity hypoventilation syndrome, cor pulmonale, severe obesity.  3. Bacterial pneumonia.  4. Bilateral lower extremity cellulitis. Cultures are pending.  5. Urinary tract infection, Escherichia coli. 6. Hypothyroidism, new diagnosis. 7. Diabetes mellitus, new diagnosis, with hemoglobin A1c 7.3.  8. Prader-Willi syndrome.   DISCHARGE CONDITION: Stable.   DISCHARGE MEDICATIONS: The patient is to resume his outpatient medications which are:  1. Fluoxetine 10 mg p.o. daily.  2. Klor-Con 20 milliequivalents p.o. daily.  3. Vitamin D 50,000 units p.o. monthly.  4. Lactobacillus probiotic capsule, one capsule twice daily.   NEW MEDICATION:  1. Zithromax 250 mg p.o. once daily for two more days.  2. Sliding scale insulin. 3. Glipizide 5 mg twice a day.  4. Synthroid 25 mcg p.o. daily. The patient was advised not to eat for approximately one hour after Synthroid is taken.  5. Keflex 500 mg 3 times daily for seven more days to treat the patient's cellulitis.  6. Spironolactone 25 mg p.o. daily.  7. Tylenol 250 mg p.o. every six hours as needed.  8. Lasix 40 mg p.o. twice daily.   DIET: Low salt, 2 grams salt, mechanical soft, 1,800 ADA, low fat, low cholesterol diet.   PHYSICAL ACTIVITY LIMITATIONS: As tolerated.   REFERRALS: Physical therapy 2 to 7 times a week.   FOLLOWUP: Follow-up appointment with Dr. Alphonsus Sias in two days after discharge.   DISCHARGE INSTRUCTIONS: The patient was also recommended to use BiPAP 10/5 with back-up rate of 10 at night always unless recommended by MD different settings or  oxygen therapy. Oxygen should be used with portable tank at 2 to 3 liters of oxygen through nasal cannula to keep pulse oximeter at around 90 to 92, but not higher.   CONSULTANTS:  1. Dr. Meredeth Ide. 2. Care management.  3. Dr. Tedd Sias. 4. Dr. Delbert Harness.  5. Nurse Roxanne Mins, wound consult.   RADIOLOGIC STUDIES: Chest x-ray, portable single view, 02/21/2012 showed findings which may reflect low-grade congestive heart failure, studies limited due to hypoinflation according to the radiologist. Repeated chest x-ray, portable single view, 02/23/2012: No significant interval change according to the radiologist. Echocardiogram was done on 02/21/2012, however, results are not available at this time. They are pending.   HISTORY: The patient is a 49 year old Caucasian male with history of Prader-Willi syndrome who presented to the hospital on 02/21/2012 with increasing shortness of breath, as well as palpitations and chills. Please refer to Dr. Teena Irani admission note on 02/21/2012. On arrival to the Emergency Room, he was noted to be hypoxic with oxygen saturations of 62%. He was started on BiPAP and his shortness of breath improved.   PHYSICAL EXAMINATION: Temperature was 99.7, pulse was 89, respiration rate WAS 24. Initially, blood pressure 148/78, saturation was 60% on room air on admission. Later on, on BiPAP and oxygen therapy, was 95%. Physical exam revealed an obese male with severe lymphedema and mild erythema of lower extremities.  LABORATORY, RADIOLOGICAL AND DIAGNOSTIC DATA:  Lab data showed an elevated beta natriuretic peptide of 5,250, glucose 162, otherwise BMP was unremarkable. The patient's bicarbonate level, CO2 level, was 38. The patient's hemoglobin A1c was elevated at 7.3. Albumin level  was 3.2. AST is slightly elevated at 45. Otherwise, liver enzymes were unremarkable. The patient's TSH was high at 63.4 and free thyroxine was low at 0.5. CBC was within normal limits. Blood cultures x2 did not show  any growth. Urinalysis was performed and showed yellow-gray hazy urine, negative for glucose, bilirubin, or ketones. Specific gravity 1.009, pH 6.0, negative for blood, protein, nitrites, or leukocyte esterase, one red blood cell, six white blood cells were noted, 2+ bacteria. Urine cultures grew 100,000 colony-forming units of Escherichia coli sensitive to nitrofurantoin, cefazolin, ceftriaxone, ciprofloxacin as well as gentamicin, imipenem and levofloxacin, intermediately to ampicillin and resistant to trimethoprim, sulfamethoxazole. ABGs were performed on 44% FiO2 and showed pH of 7.36, pCO2 of 84, pO2 75, saturation was 94.3%. EKG showed normal sinus rhythm at 83 beats per minute, voltage criteria for left ventricular hypertrophy, nonspecific T wave abnormality was noted. Chest x-ray revealed congestive heart failure as well as poor inspiratory effort, hypoinflation.   HOSPITAL COURSE: The patient was admitted to the hospital. He was started on antibiotic therapy for presumed pneumonia as well as Lasix and nitroglycerin topically for congestive heart failure. With this therapy, he improved significantly. Over a period of time of his stay in the hospital time he diuresed at least 5.2 liters of fluid after which he felt much more comfortable. At night he was managed on BiPAP. He was evaluated by Dr. Meredeth IdeFleming who followed him along. He was doing progressively better and his oxygen saturations improved. Initially on arrival to the hospital, he required 6 liters of oxygen through nasal cannula, now his oxygen saturations are 93% on 4 liters of oxygen through nasal cannula. It was recommended to decrease his 02 flow to keep his pulse oximetry 88% to 92%, which is likely the patient's baseline. It was postulated that the patient had obstructive sleep apnea and nocturnal oximetry study was performed. Nocturnal oximetry done on the 21st of May 2013 revealed significant desaturations, at least 31 desaturations during  testing period of approximately two hours. The patient had average oxygen saturation of 86% and his low oxygen saturations were at around 62%. Of note, the patient was, at that time, on 2 liters of oxygen through nasal cannula, not on room air, telling us that, in fact, he does have significant hypoxia, as well as possibly hypopnea. It was recommended to continue oxygen therapy at daytime. However, whenever the patient is asleep or resting, the patient would benefit from BiPAP and the patient is to use BiPAP at 10/5 with back-up rate of 10 at night unless recommended by primary care physician differently. He is to use oxygen therapy as mentioned above and keep his oximetry rate not more than 92%.   In regards to congestive heart failure, echocardiogram, as mentioned above, was ordered. However, results are not received. It was postulated that the patient had cor pulmonale due to severe obesity, as well as obesity hypoventilation syndrome related to right-sided heart failure. The patient was continued on Lasix IV initially in the hospital, however, Lasix was cut down to oral upon discharge because his bicarbonate started creeping up to higher levels. It is felt that the patient would benefit from continuous Lasix therapy because he is significantly improved, however, we may need to lower even Lasix dose as outpatient to 40 mg once daily dose due to concerns of overdiuresis and volume contraction. It is recommended to follow the patient's creatinine levels as an outpatient as well as his bicarbonate levels as outpatient. On the day of  discharge, the patient's BUN and creatinine were 14 and 0.73. However, the patient's bicarbonate level was 44. He did not use BiPAP the night before and it was felt that the patient's high bicarbonate level could be related to just not using BiPAP. However, could not exclude contraction alkalosis.   Regarding his pneumonia, the patient was continued on antibiotic therapy with  Zithromax. Initially, received also Rocephin. The patient is to continue antibiotic therapy for two more days to complete course. The patient's pneumonia seemed to be resolving.   In regards to lower extremity cellulitis, the patient had significant redness in his lower extremities, also purulent discharge. Wound cultures were taken, however, those results are still pending at the time of dictation. Wound cultures were reported to grow a moderate growth of gram-negative rod and light growth of unidentified organism. ID to follow, DR Alphonsus Sias and patient were instructed  to follow results. The patient, however, improved significantly on Keflex alone. He is to continue antibiotic therapy with Keflex for seven more days to complete course. His condition is improving in regards to his lower extremity cellulitis.   In regards to urinary tract infection, Escherichia coli was cultured, which is sensitive to Keflex. The patient is to continue Keflex. The patient was noted to have hypothyroidism. His TSH was found to be high and FT4 as well as FT3 were also low. The patient was started on Synthroid, was evaluated by endocrinologist, Dr. Tedd Sias who followed the patient along in the hospital. She recommended to continue therapy with 25 mcg p.o. daily dose of Synthroid and have his TSH checked in 2 to 4 weeks and adjust dose slowly if needed.   The patient was diagnosed with new diagnosis of diabetes. He was started on glipizide as well as sliding scale insulin. His blood glucose levels significantly improved with diet control. His diet was changed to diabetic diet. His blood glucose levels on the 23rd of May 2013 fasting were 123. The patient is to continue therapy with glipizide as well as sliding scale insulin as outpatient.   Regarding Prader-Willi syndrome, the patient's family was informed that the patient would benefit from physical therapy as well as diet control, as well as possibly placement or at least,  rehabilitation, which was recommended by physical therapy. The patient himself, however, refused placement and psychological consultation with Dr. Delbert Harness was obtained. Dr. Delbert Harness felt that the patient does not possess capacity to make decisions whether to be discharged to rehabilitation facility or to return home and it was felt that it would be beneficial for the patient to be in a structured environment where his eating habits will be controlled and his ability to exercise also would be enhanced with physical therapy. The patient's family was initially agreeable to this. However, as time progressed, apparently they were more and more reluctant to get the patient to rehabilitation facility and now we are working with the family to figure out what is best for the patient.   The patient was noted to be somewhat a little anemic. On the day of discharge, 02/24/2012, the patient's hemoglobin levels is 12.4. The patient was not noted to have any bleeding except the subcutaneous bleeding hemorrhage into his left arm tissues. It was felt that it could have been related to this. It was unclear why he had that bleeding. However, heparin subcutaneously while in the hospital was implicated as well as possibly IV line placement in that area. The patient will likely be going to discharge to a skilled  nursing facility today.   TIME SPENT: 40 minutes.   ____________________________ Katharina Caper, MD rv:ap D: 02/24/2012 09:42:52 ET T: 02/24/2012 10:31:04 ET JOB#: [id number]  cc: Katharina Caper, MD, <Dictator> Karie Schwalbe, MD Keandra Medero MD ELECTRONICALLY SIGNED 03/04/2012 15:31

## 2015-01-26 NOTE — Consult Note (Signed)
Brief Consult Note: Diagnosis: adjustment disorder.   Patient was seen by consultant.   Consult note dictated.   Discussed with Attending MD.   Comments: Psychiatry: Patient seen. Chart reviewed. Also spoke with mother. Patient made suicidal statements earlier out of frustration but now denies any suicidal ideas and admits he was just frustrated. No history of suicidality. Patient agrees to go back to rehab and be safe. Will dc IVC.  Electronic Signatures: Audery Amellapacs, Zamar Odwyer T (MD)  (Signed 28-Jun-13 14:49)  Authored: Brief Consult Note   Last Updated: 28-Jun-13 14:49 by Audery Amellapacs, Temima Kutsch T (MD)

## 2015-01-26 NOTE — Consult Note (Signed)
Chief Complaint and History:   Primary Physician Dr. Alphonsus SiasLetvak    Referring Physician Dr. Randol KernElgergawy    Chief Complaint Elevated TSH   Allergies:  No Known Allergies:   Assessment/Plan:   Assessment/Plan 49 yo M with Prader-Willi syndrome, morbid obesity, severe bilateral lower extremity lymphedema who was admitted with SOB attributed to bilateral pna. Patient was seen and examined and chart was reviewed.  1) Elevated TSH - no known prior h/o hypothyroidism. Due to concerns of CHF, I recommend starting with low dose thyroid hormone replacement and titrating dose slowly. He was given a loading dose of 75 mcg today which is reasonable. Will give 25 mcg daily and recommend rechecking TSH in 2-4 weeks and adjusting dose again.  2) Morbid obesity - commonly seen in Jeanella CaraPrader Willi. Continue with 1800 kcal diet and avoid extra dietary intake.  3) Diabetes - A1c is 7.2%. I explained to patient that he has diabetes. Family needs to be notified and educated. Will start NovoLog SSI and glipizde 5 mg bid. Could use metformin in future after possible CHF stabilizes.  Full consult to be dictated.   Electronic Signatures: Raj JanusSolum, Anna M (MD)  (Signed 20-May-13 13:21)  Authored: Chief Complaint and History, ALLERGIES, Assessment/Plan   Last Updated: 20-May-13 13:21 by Raj JanusSolum, Anna M (MD)

## 2015-01-26 NOTE — Discharge Summary (Signed)
PATIENT NAME:  Gordon [name], Gordon [name] MR#:  [id number] DATE OF BIRTH:  10/21/1965  DATE OF ADMISSION:  02/21/2012 DATE OF DISCHARGE:  02/25/2012  ADDENDUM  The patient was not discharged on 02/24/2012 as the  patient's mother, who is power of attorney for medical care and the caretaker was not really sure if she should have him in a skilled nursing facility. She was jumping from one decision to other one to take him home or getting him to skilled nursing facility. Finally, she decided to get rehabilitation placement at [facility]. Over this past 24 hours, the patient's urine output was somewhat lower. His CO2, as mentioned above, was higher concerning for possible intravascular depletion. That is why we made decision to decrease his Lasix dose and make sure his medications are going to be adjusted.   DISCHARGE MEDICATIONS: The patient is to resume his outpatient medications which are:  1. Fluoxetine 10 mg p.o. daily. 2. Klor-Con 20 milliequivalents p.o. daily.  3. Vitamin D 50,000 units p.o. monthly.   ADDITIONAL MEDICATIONS:  1. Lactobacillus probiotic capsule one capsule twice daily.  2. Zithromax 250 mg p.o. for two more days.  3. Sliding scale insulin.  4. Glipizide 5 mg p.o. twice daily.  5. Synthroid 25 mcg p.o. daily. The patient was advised not to eat for approximately one hour after the Synthroid was taken.  6. Keflex 500 milligrams p.o. 3 times daily for seven more days.  7. Spironolactone 25 mg p.o. daily.  8. Tylenol 650 mg p.o. every six hours as needed.  9. Lasix 20 mg p.o. daily. This is a new dose from previously dictated 40 mg p.o. twice daily. It is going to be 20 mg p.o. daily. However, the patient is to hold this medication for two days and resume it only on 02/28/2012.  10. Home oxygen: 4 liters of oxygen through nasal cannula to keep pulse oximetry at around 90 to 92%.   DISCHARGE INSTRUCTIONS: The patient is also to continue BiPAP at 10/5 with set respiration rate  of 10 at nighttime with oxygen bleed in if needed to keep pulse oximetry at around 88% to 92%.   REFERRAL: The patient is referred to physical therapist 2 to 7 times a week.   FOLLOW-UP: He is to follow up with Dr. Alphonsus SiasLetvak on 02/29/2012 at 11 a.[name].   TIME SPENT: 40 minutes.    ____________________________ Katharina Caperima Jourdain Guay, MD rv:ap D: 02/25/2012 13:58:36 ET T: 02/25/2012 15:29:22 ET JOB#: [id number]  cc: Katharina Caperima Shakura Cowing, MD, <Dictator> Karie Schwalbeichard I. Letvak, MD Arliss Frisina Winona LegatoVAICKUTE MD ELECTRONICALLY SIGNED 03/04/2012 15:32

## 2015-01-26 NOTE — Discharge Summary (Signed)
PATIENT NAME:  Gordon [name], Denym M MR#:  [id number] DATE OF BIRTH:  01/25/1966  DATE OF ADMISSION:  02/21/2012 DATE OF DISCHARGE:  02/25/2012  ADDENDUM  The patient's lower extremity wound care instructions as recommended by Nurse [facility] as follows:   Wash bilateral lower extremities with chlorhexidine which is Hibiclens and rinse well with tap water. Apply ABD pads to any open areas and wrap entire lower extremities in Chux pads. If more than daily dressing changes required due to excessive drainage, may wrap lower extremities in adult diapers instead of Chux pads. Secure with tape. Contact Glee Arvinonna Partin at the Wound Center or make an appointment with Wound Center for the next week or two.   NURSING MISCELLANEOUS ITEM: Obtain chlorhexidine skin cleanser from supply chain and use daily on lower extremities.    ____________________________ Katharina Caperima Lam Mccubbins, MD rv:ap D: 02/25/2012 16:00:51 ET T: 02/25/2012 16:14:12 ET JOB#: [id number]  cc: Katharina Caperima Takeisha Cianci, MD, <Dictator> Kortney Schoenfelder MD ELECTRONICALLY SIGNED 03/04/2012 15:32

## 2015-01-26 NOTE — Consult Note (Signed)
PATIENT NAME:  Gordon [name], Gordon [name] MR#:  [id number] DATE OF BIRTH:  08/21/1966  DATE OF CONSULTATION:  03/31/2012  REFERRING PHYSICIAN:   CONSULTING PHYSICIAN:  John T. Clapacs, MD  IDENTIFYING INFORMATION AND REASON FOR CONSULT: This is a 49-year-old man with Prader-Willi Syndrome, a congenital disorder usually leading to some degree of cognitive impairment and obesity who is brought here by emergency services from his rehab center for making suicidal statements.   CHIEF COMPLAINT: "I was frustrated."   REASON FOR CONSULTATION: To evaluate need for psychiatric treatment.   HISTORY OF PRESENT ILLNESS: Information obtained from the patient and the patient's mother and from the chart. This patient was recently here at [facility] for treatment of a pneumonia and multiple other medical problems. At the time of discharge he did not appear to be well enough to go back to his home environment and so was discharged to rehab treatment at [facility]. He has been there for the last 3 to 4 days. Apparently he made some statements while he was there that he was going to kill himself or strangle himself. The patient tells me today that he became frustrated and angry because the staff there were not taking care of him. He says that they took him out of his bed and put him in a chair and left him for too long until his body became extremely uncomfortable and painful and that when he called out no one came to help him. He says that then they were mean to him and told him that he just needed to hush up because they did not have staff to help him. Patient at that point became very frustrated and made the statements that he was going to kill himself. On interview today the patient denies that he has been consistently depressed. He is still able to enjoy usual activities such as reading that he has been interested in in the past. He does continue to feel physically sick and tired. Recovering from his  pneumonia and other infections he is weak and is having trouble doing the rehab therapy. He denies to me today that he has any thought of killing himself. He says that he just said that because he was frustrated and upset. I spoke with the mother who has been with him here all day and knows him extremely well. She also does not think that he was actually serious or had any intent about killing himself but was just frustrated and upset.   PAST PSYCHIATRIC HISTORY: Evidently no specific psychiatric treatment. He has been disabled essentially lifelong, although apparently he went to school into high school. They do not report any specific past psychiatric treatment. No history of treatment of depression. No past suicidal ideation or behavior expressed. No history of violence.   SOCIAL HISTORY: Patient has lived with his mother virtually all of his life. She says that back when he was in his 20s he briefly was in a rehab facility in [place] but other than that he has been at home ever since. He has not even been able to leave their home for several years recently. The patient is usually very dependent on his mother and she has devoted herself to taking care of him. Being put in a rehab facility has been very disruptive and traumatic to him. The patient does not seem to have the capacity to really fully process and understand all of that in a mature manner.   PAST MEDICAL HISTORY: Prader-Willi Syndrome   is characterized by causing people to have a grossly exaggerated appetite and to often eat themselves into morbid obesity which is what has happened with this gentleman. Because of this he has recently developed infections and diabetes. He also appears to have at least some degree of cognitive impairment and emotional stunting from it although the full extent to that is unknown.   CURRENT MEDICATIONS: Multiple medications including:  1. Fluoxetine 10 mg per day. 2. Klor-Con 20 mEq per day. 3. Vitamin D 50,000  units monthly. 4. Zithromax for two more days after his last discharge. 5. Glipizide. 6. Synthroid. 7. Keflex. 8. Spironolactone. 9. Lasix. 10. Home oxygen.   ALLERGIES: No known drug allergies.   REVIEW OF SYSTEMS: Complains of feeling tired. Denies feeling depressed. Does feel frustrated at times. Denies psychotic symptoms. Denies any suicidal ideation.   MENTAL STATUS EXAM: Obese ill appearing man interviewed in an Emergency Room bed. He wanted his mother to be present with him during the interview and for me to talk to her as well. Patient was sleepy throughout much of the interview. He says that he has not slept and that he has gotten some medication which has made him tired. His eye contact was only intermittent. Speech was quiet, somewhat slurred, a little difficult to understand and decreased in total amount. Psychomotor activity very limited because of his physical disability. Affect shows some odd smiling at times, otherwise blunted. Mood stated as being frustrated. Thoughts are simplistic and slow, poverty of thought. No grossly bizarre thought. No evidence of delusions. No hallucinations evident. Denies suicidal or homicidal ideation. Full cognitive testing not done at this time. Patient appears to be probably of low average intelligence with some impairment in emotional development. Judgment and insight impaired.   ASSESSMENT: This is a 49-year-old man with a chronic genetic disorder, chronically impaired physically and mentally. Recently has been sick and also had to adjust to being in a rehab facility instead of at home. Does not have the greatest emotional resources probably to deal with that anyway. He became predictably frustrated when his needs were not met the way he felt they should be. He made some suicidal statements but does not have a past history of suicidal behavior or depression and is not giving a history of a major depression right now. Currently denies suicidal ideation.  Is not acting out on it. Is able to acknowledge that he really does not want to kill himself and that he wants to enjoy things that he has enjoyed in the past before. His mother strongly feels that he is not actually suicidal or a danger to himself. At this point I do not think there is enough evidence that he is actually dangerous to himself to warrant involuntary commitment. Furthermore I do not think there is any treatment that he would be able to participate in in the in a psychiatric hospital anyway. I do not think currently he meets commitment criteria. I discussed this with the Emergency Room doctor who is accepting this plan.   TREATMENT PLAN: Discontinue involuntary commitment paperwork. Supportive counseling and therapy done. Support offered to the mother as well around her frustration and fatigue. At this time I do not think he needs further Emergency Room or hospital care and should be transferred back to [facility]. Hopefully they will be able to work with him in meeting his needs in a sensitive way without getting into more emotional struggles.   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   SECONDARY DIAGNOSES:  AXIS I: No further.   AXIS II: No diagnosis.   AXIS III:  1. Prader-Willi Syndrome. 2. Obesity. 3. Diabetes. 4. Recent pneumonia and urinary tract infection, still recovering.   AXIS IV: Chronic severe stress from his medical impairment.   AXIS V: Functioning at time of evaluation 51.  ____________________________ John T. Clapacs, MD jtc:cms D: 03/31/2012 14:59:31 ET T: 03/31/2012 15:18:30 ET JOB#: [id number]  cc: John T. Clapacs, MD, <Dictator> JOHN T CLAPACS MD ELECTRONICALLY SIGNED 04/01/2012 23:18 

## 2015-02-19 IMAGING — CR DG CHEST 1V PORT
1 series · 1 of 1 positions shown · non-contrast
Comparison: 02/25/2012

CLINICAL DATA: Short of breath

EXAM:
PORTABLE CHEST - 1 VIEW

[ap]
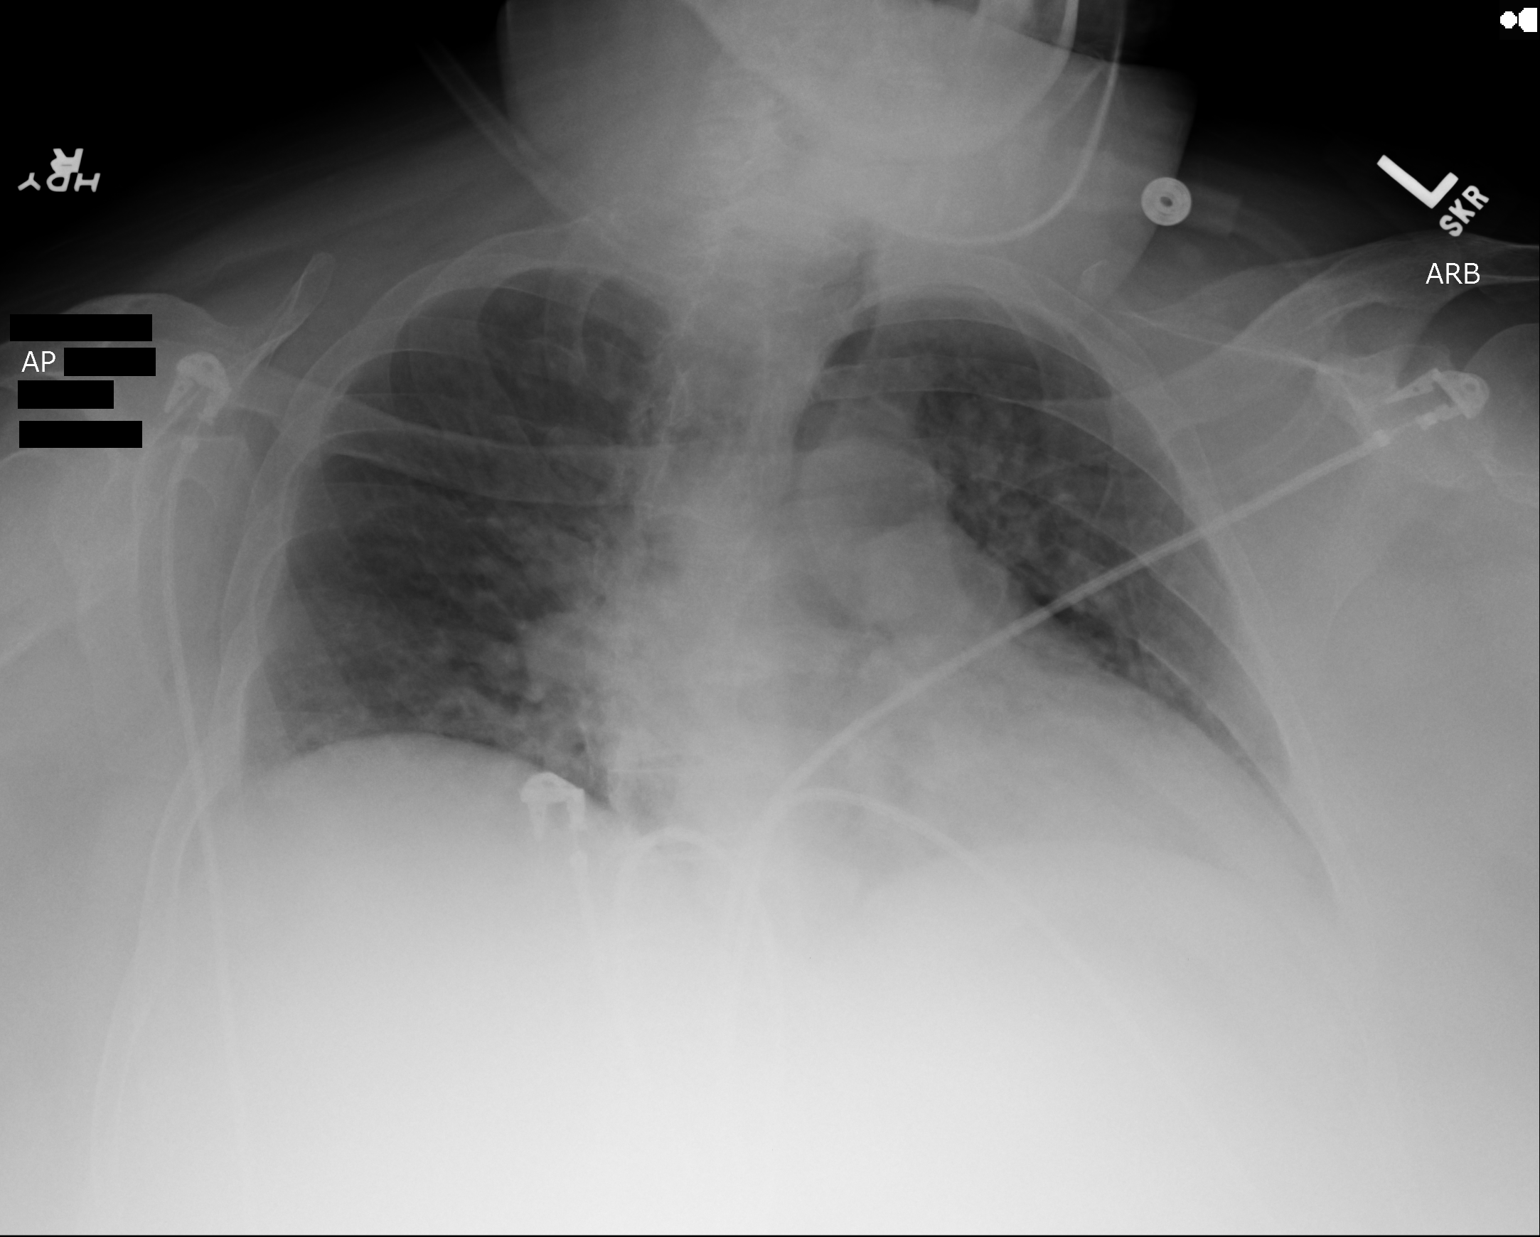

[1 of 1 positions shown; findings below may reference images not displayed]

FINDINGS: Hypoventilation with decreased lung volume and mild bibasilar
atelectasis. Cardiac enlargement with mild vascular congestion. No
edema or effusion.
IMPRESSION: Hypoventilation with bibasilar atelectasis. Mild vascular congestion
also present.

## 2015-02-19 IMAGING — US ABDOMEN ULTRASOUND LIMITED
1 series · 14 of 25 positions shown · non-contrast
Comparison: None.

CLINICAL DATA: Elevated liver function tests. Current history of
Horn syndrome.

EXAM:
US ABDOMEN LIMITED - RIGHT UPPER QUADRANT

[Series 1: abdomen ultrasound limited · 0.37mm/px · 14 of 27 slices shown]
[im 1/27]
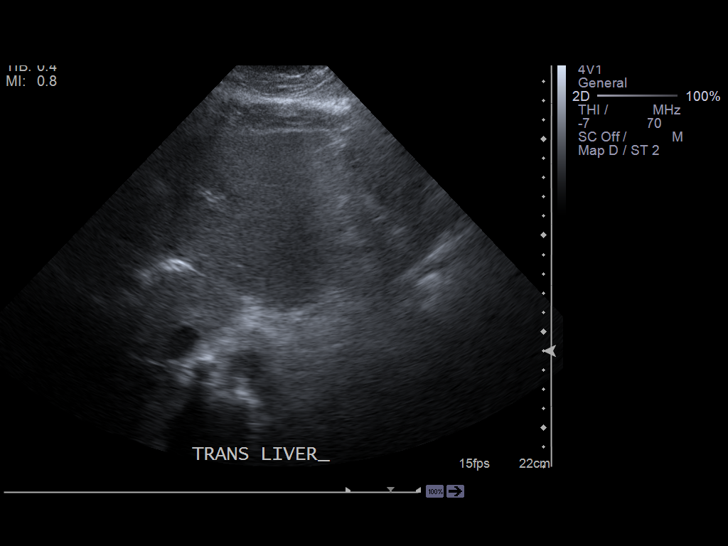
[im 3/27]
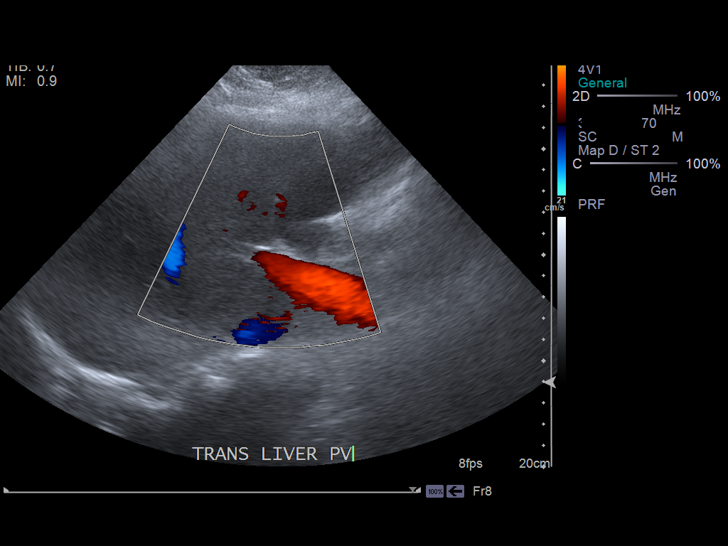
[im 5/27]
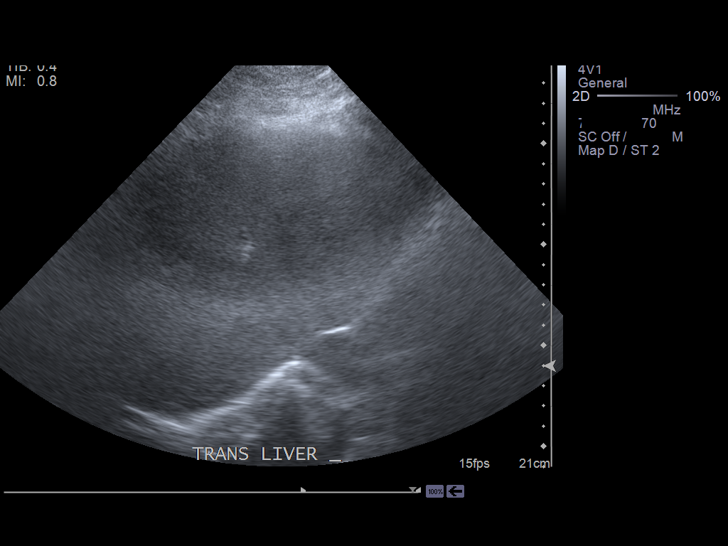
[im 7/27]
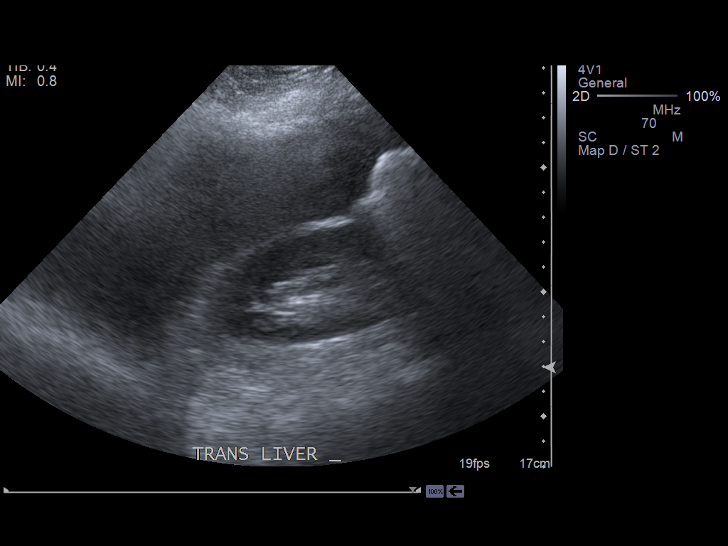
[im 9/27]
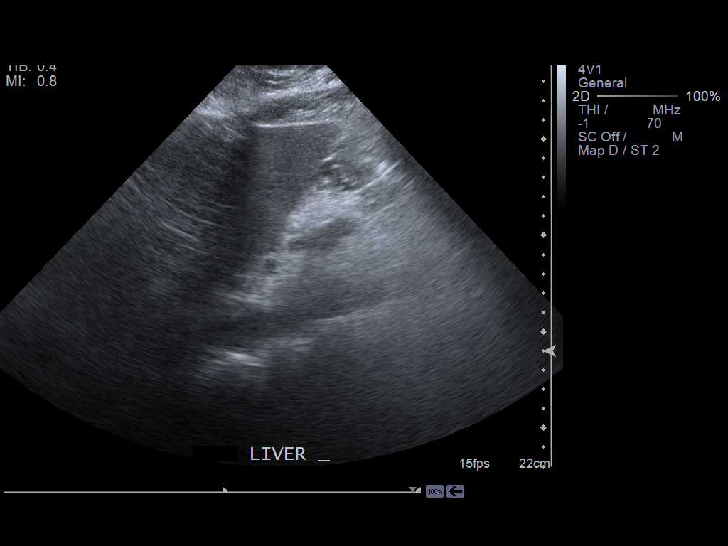
[im 10/27]
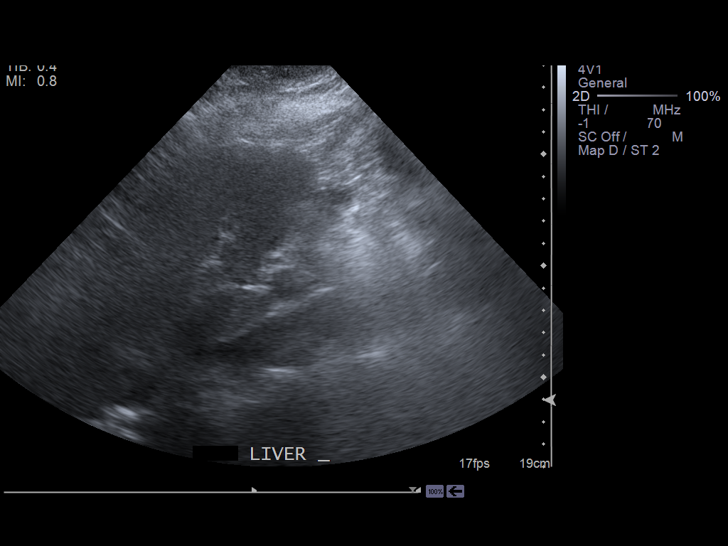
[im 12/27]
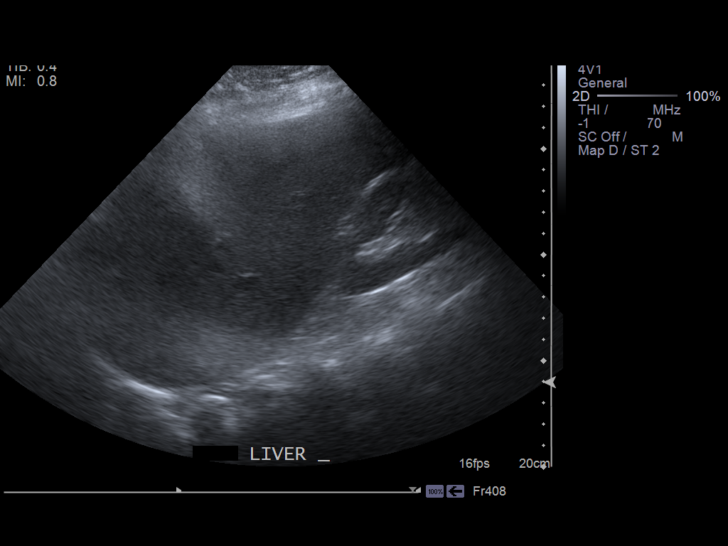
[im 15/27]
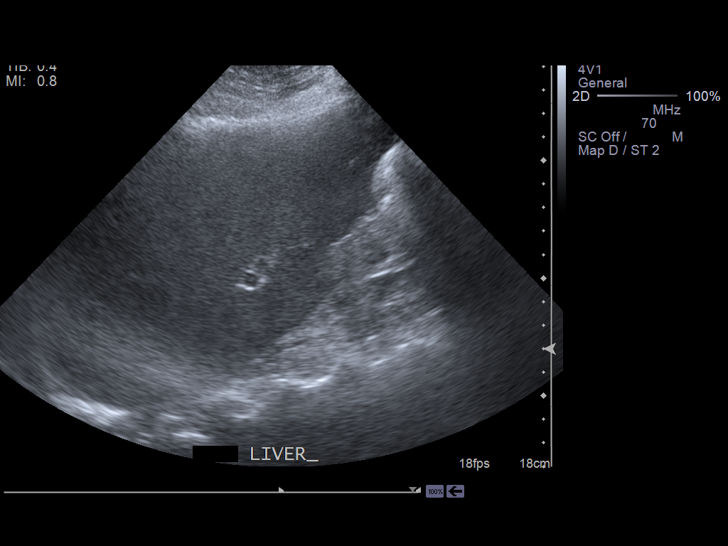
[im 17/27]
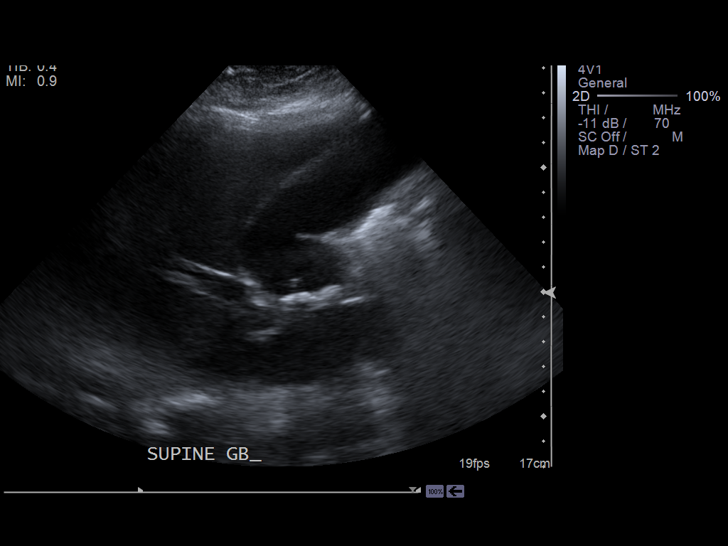
[im 18/27]
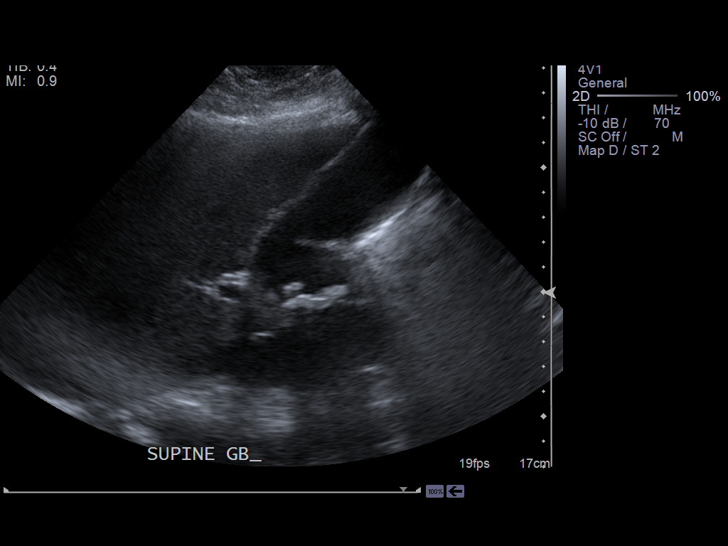
[im 20/27]
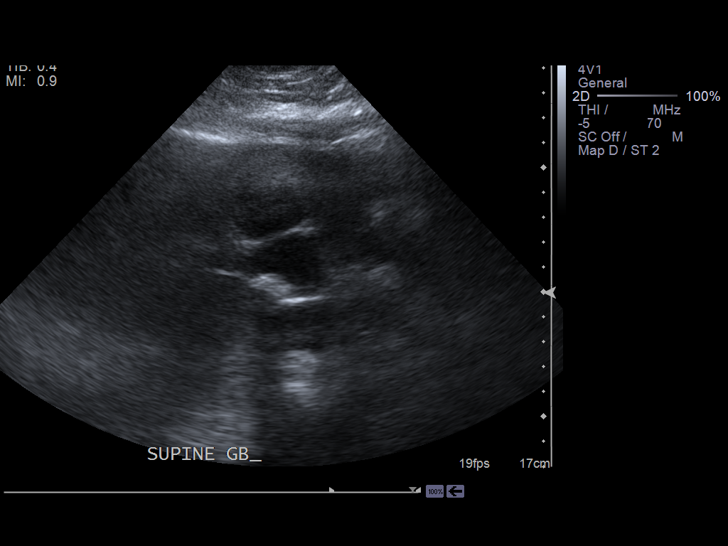
[im 22/27]
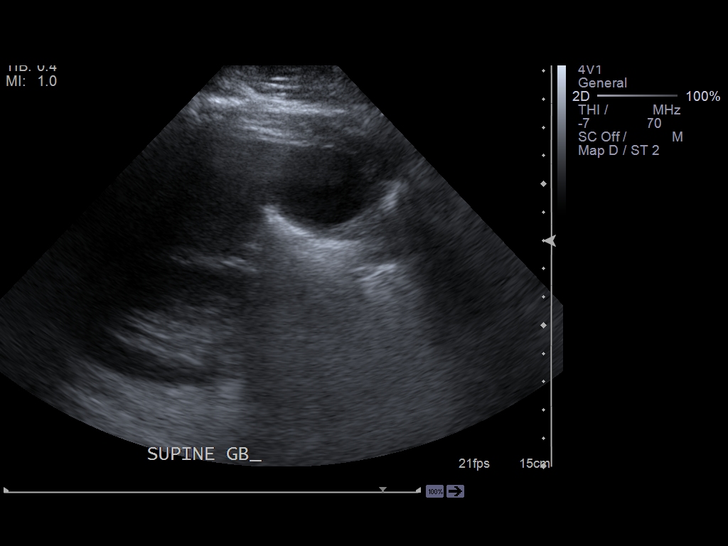
[im 24/27]
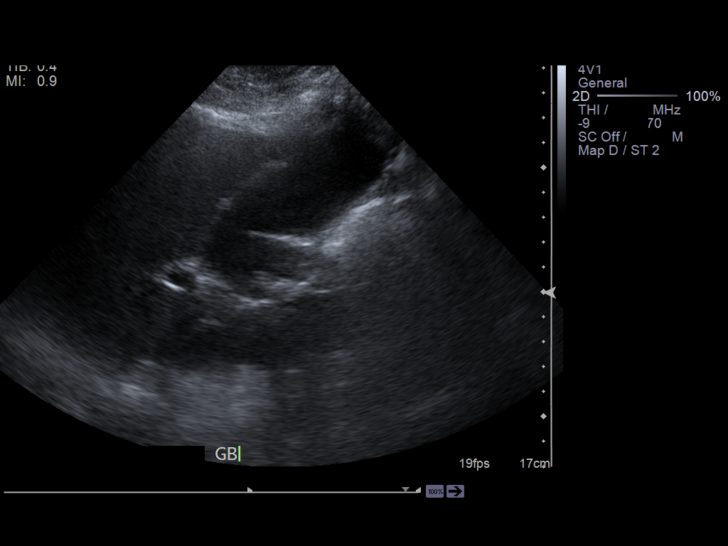
[im 27/27]
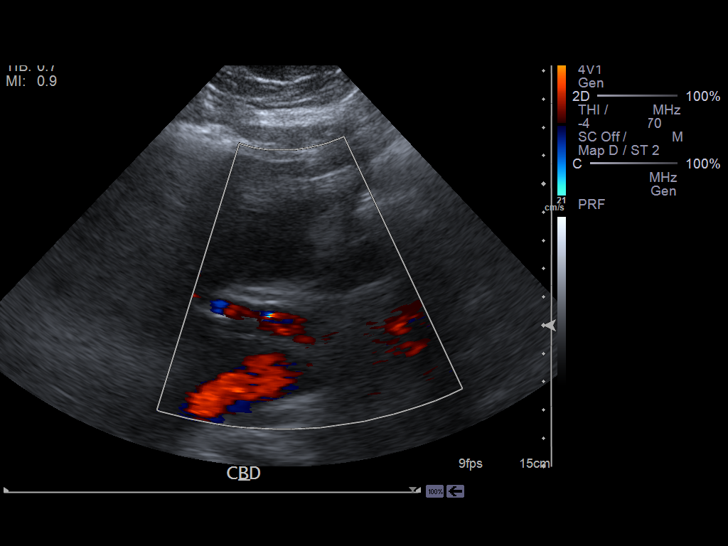

[14 of 25 positions shown; findings below may reference images not displayed]

FINDINGS: Gallbladder:

Smaller shadowing gallstones, the largest on the order of
approximately 12 mm. No gallbladder wall thickening or
pericholecystic fluid.

Common bile duct:

Diameter: 2 mm.

Liver:

Diffusely increased and coarsened echotexture without focal hepatic
parenchymal abnormality. Patent portal vein with hepatopetal flow.
IMPRESSION: 1. Cholelithiasis without sonographic evidence of acute
cholecystitis. No biliary ductal dilation.
2. Diffuse hepatic steatosis versus hepatocellular disease without
focal hepatic parenchymal abnormality.

## 2015-02-22 IMAGING — CR DG CHEST 1V PORT
1 series · 1 of 1 positions shown · non-contrast
Comparison: 09/30/2013

CLINICAL DATA: Pneumonia

EXAM:
PORTABLE CHEST - 1 VIEW

[ap]
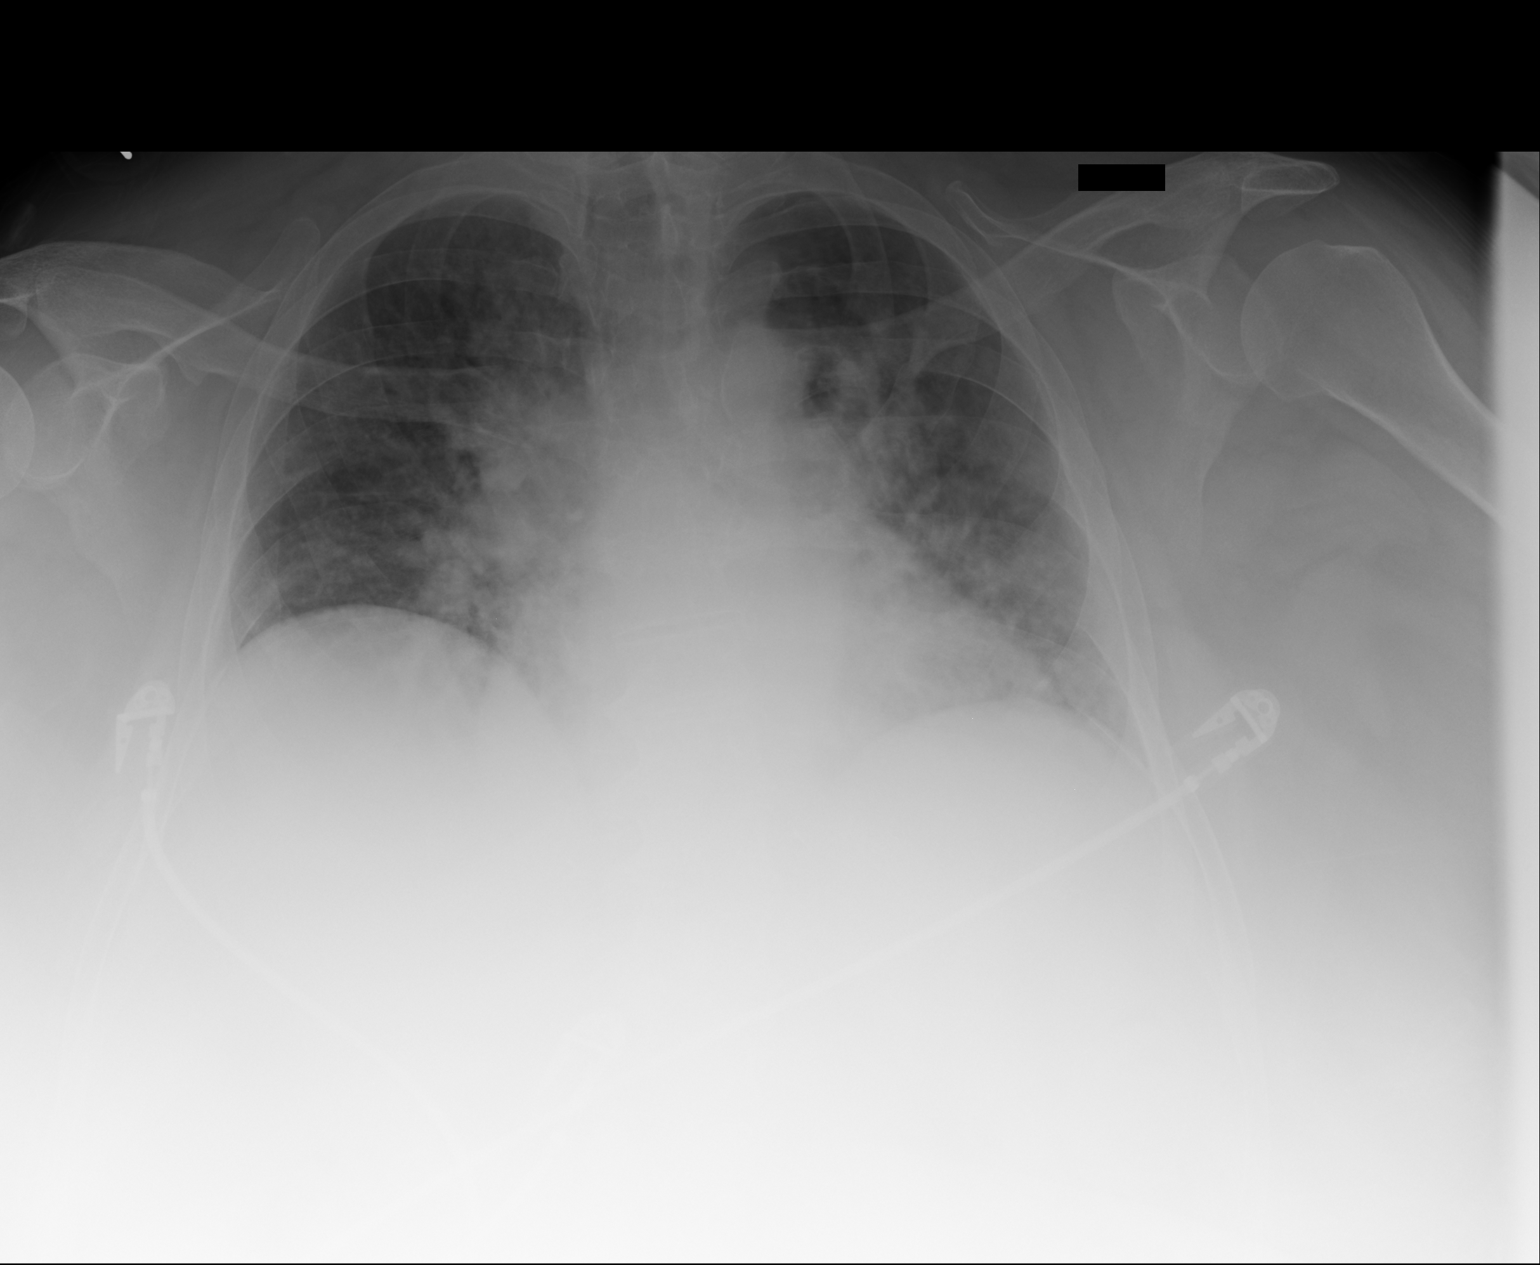

[1 of 1 positions shown; findings below may reference images not displayed]

FINDINGS: The overall inspiratory effort is poor. The cardiac shadow remains
enlarged. Increased central vascular congestion is noted. No
significant pulmonary edema is seen.
IMPRESSION: Increased central vascular congestion.

## 2015-03-08 IMAGING — CR DG CHEST 1V PORT
1 series · 1 of 1 positions shown · non-contrast
Comparison: 10/03/2013

CLINICAL DATA: Shortness of breath and chest

EXAM:
PORTABLE CHEST - 1 VIEW

[ap]
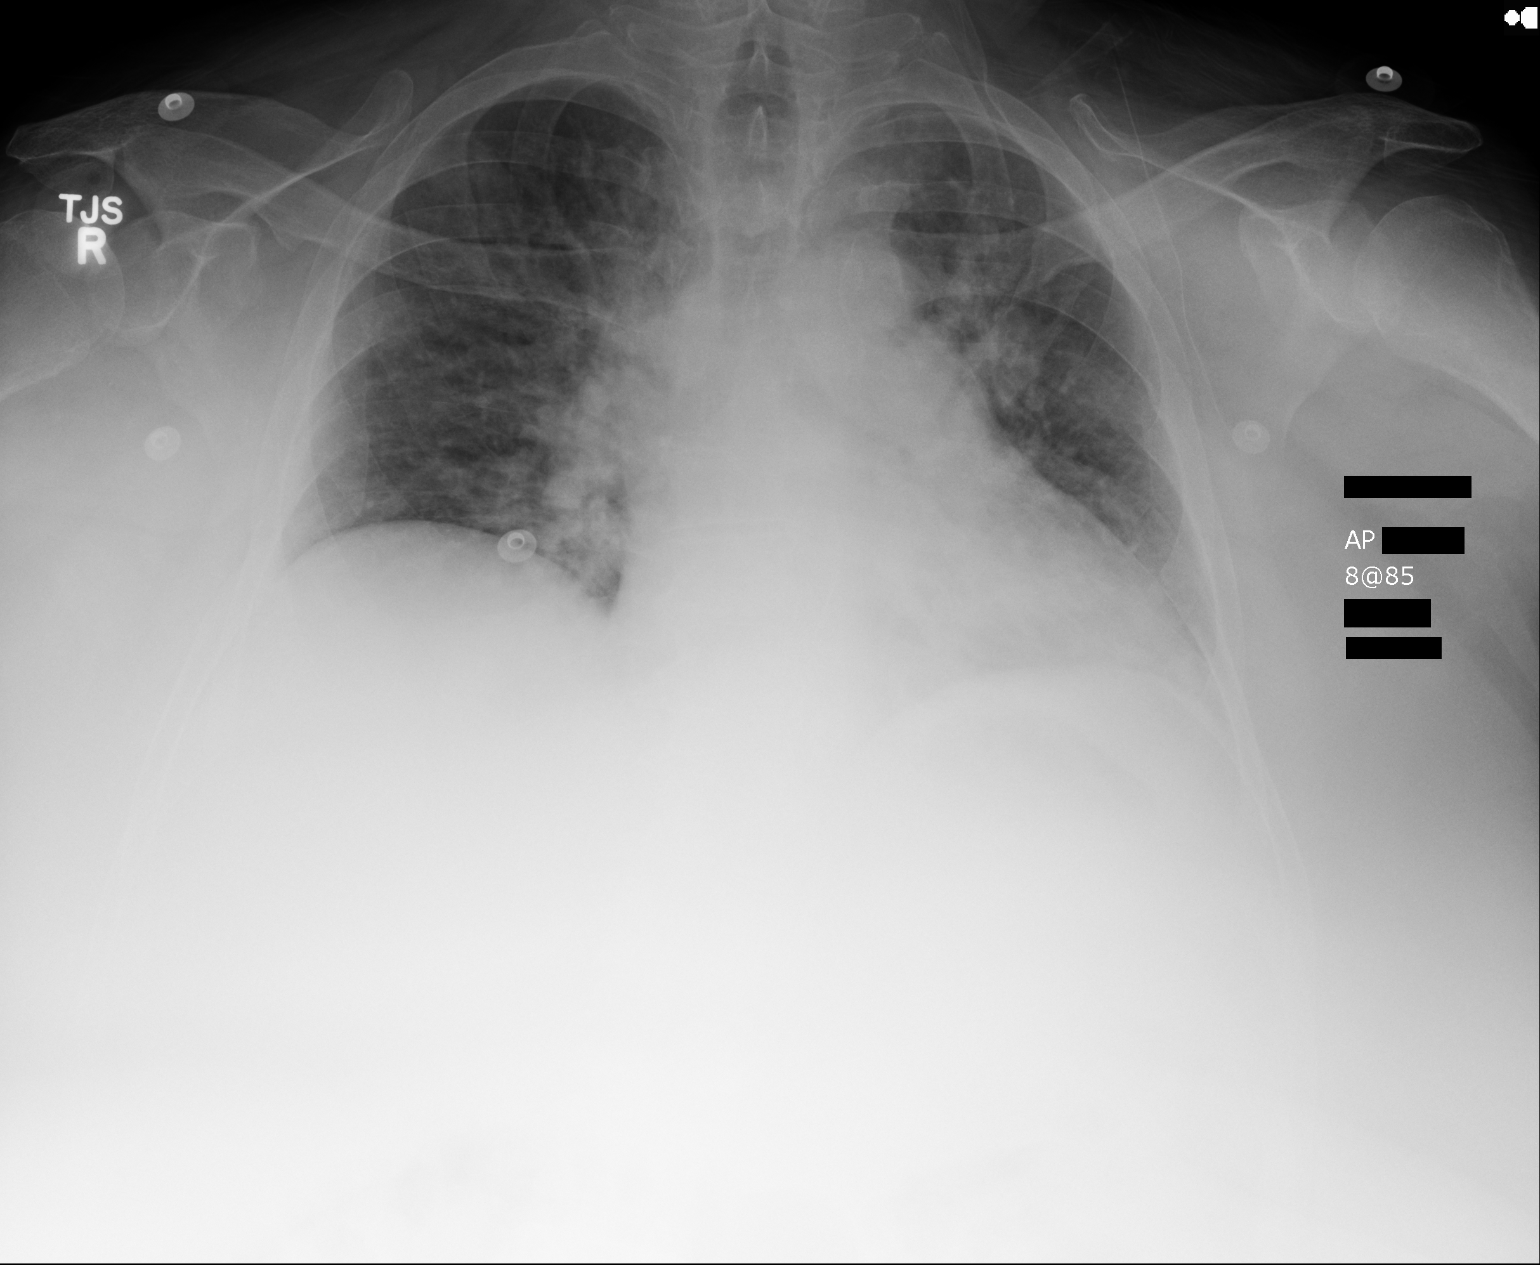

[1 of 1 positions shown; findings below may reference images not displayed]

FINDINGS: Cardiomegaly. Hilar enlargement which is likely vascular.
Hypoaeration. No overt edema or definite consolidation. No effusion.
No pneumothorax.
IMPRESSION: Hypoaeration and pulmonary venous congestion. When able, PA and
lateral radiography would be useful in further evaluating the hila.
